# Patient Record
Sex: Male | Born: 1950
Health system: Southern US, Community
[De-identification: ages and names within clinical notes are randomized; demographics above are authoritative.]

## PROBLEM LIST (undated history)

## (undated) DIAGNOSIS — I82409 Acute embolism and thrombosis of unspecified deep veins of unspecified lower extremity: Secondary | ICD-10-CM

## (undated) DIAGNOSIS — J9811 Atelectasis: Secondary | ICD-10-CM

## (undated) DIAGNOSIS — E1110 Type 2 diabetes mellitus with ketoacidosis without coma: Secondary | ICD-10-CM

## (undated) DIAGNOSIS — I509 Heart failure, unspecified: Secondary | ICD-10-CM

## (undated) DIAGNOSIS — N210 Calculus in bladder: Secondary | ICD-10-CM

## (undated) DIAGNOSIS — R0600 Dyspnea, unspecified: Secondary | ICD-10-CM

## (undated) DIAGNOSIS — Z87442 Personal history of urinary calculi: Secondary | ICD-10-CM

## (undated) DIAGNOSIS — E119 Type 2 diabetes mellitus without complications: Secondary | ICD-10-CM

## (undated) DIAGNOSIS — R4182 Altered mental status, unspecified: Secondary | ICD-10-CM

## (undated) DIAGNOSIS — R229 Localized swelling, mass and lump, unspecified: Secondary | ICD-10-CM

## (undated) DIAGNOSIS — I2699 Other pulmonary embolism without acute cor pulmonale: Secondary | ICD-10-CM

## (undated) DIAGNOSIS — I10 Essential (primary) hypertension: Secondary | ICD-10-CM

## (undated) DIAGNOSIS — I428 Other cardiomyopathies: Secondary | ICD-10-CM

## (undated) DIAGNOSIS — J189 Pneumonia, unspecified organism: Secondary | ICD-10-CM

## (undated) HISTORY — PX: COLONOSCOPY: SHX174

---
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
## 2002-08-25 DIAGNOSIS — IMO0002 Reserved for concepts with insufficient information to code with codable children: Secondary | ICD-10-CM

## 2002-08-25 HISTORY — DX: Reserved for concepts with insufficient information to code with codable children: IMO0002

## 2009-08-25 DIAGNOSIS — J189 Pneumonia, unspecified organism: Secondary | ICD-10-CM

## 2009-08-25 HISTORY — DX: Pneumonia, unspecified organism: J18.9

## 2015-07-05 ENCOUNTER — Ambulatory Visit: Payer: Self-pay | Admitting: Family Medicine

## 2015-08-02 ENCOUNTER — Ambulatory Visit: Payer: Self-pay | Admitting: Family Medicine

## 2015-10-10 ENCOUNTER — Ambulatory Visit: Payer: Self-pay | Admitting: Family Medicine

## 2015-11-07 ENCOUNTER — Ambulatory Visit: Payer: Self-pay | Admitting: Family Medicine

## 2016-06-06 DIAGNOSIS — N400 Enlarged prostate without lower urinary tract symptoms: Secondary | ICD-10-CM | POA: Diagnosis not present

## 2016-06-06 DIAGNOSIS — N210 Calculus in bladder: Secondary | ICD-10-CM | POA: Diagnosis not present

## 2016-06-06 DIAGNOSIS — R8290 Unspecified abnormal findings in urine: Secondary | ICD-10-CM | POA: Diagnosis not present

## 2016-06-06 DIAGNOSIS — I10 Essential (primary) hypertension: Secondary | ICD-10-CM | POA: Diagnosis not present

## 2016-06-06 DIAGNOSIS — Z1211 Encounter for screening for malignant neoplasm of colon: Secondary | ICD-10-CM | POA: Diagnosis not present

## 2016-06-06 DIAGNOSIS — Z125 Encounter for screening for malignant neoplasm of prostate: Secondary | ICD-10-CM | POA: Diagnosis not present

## 2016-08-07 DIAGNOSIS — R600 Localized edema: Secondary | ICD-10-CM | POA: Diagnosis not present

## 2016-08-07 DIAGNOSIS — R319 Hematuria, unspecified: Secondary | ICD-10-CM | POA: Diagnosis not present

## 2016-08-07 DIAGNOSIS — Z79899 Other long term (current) drug therapy: Secondary | ICD-10-CM | POA: Diagnosis not present

## 2016-08-07 DIAGNOSIS — I10 Essential (primary) hypertension: Secondary | ICD-10-CM | POA: Diagnosis not present

## 2016-08-07 DIAGNOSIS — D751 Secondary polycythemia: Secondary | ICD-10-CM | POA: Diagnosis not present

## 2016-08-07 DIAGNOSIS — N529 Male erectile dysfunction, unspecified: Secondary | ICD-10-CM | POA: Diagnosis not present

## 2016-08-14 DIAGNOSIS — Z23 Encounter for immunization: Secondary | ICD-10-CM | POA: Diagnosis not present

## 2016-08-14 DIAGNOSIS — Z79899 Other long term (current) drug therapy: Secondary | ICD-10-CM | POA: Diagnosis not present

## 2016-08-14 DIAGNOSIS — D751 Secondary polycythemia: Secondary | ICD-10-CM | POA: Diagnosis not present

## 2016-08-14 DIAGNOSIS — N529 Male erectile dysfunction, unspecified: Secondary | ICD-10-CM | POA: Diagnosis not present

## 2016-08-14 DIAGNOSIS — R319 Hematuria, unspecified: Secondary | ICD-10-CM | POA: Diagnosis not present

## 2016-08-14 DIAGNOSIS — I10 Essential (primary) hypertension: Secondary | ICD-10-CM | POA: Diagnosis not present

## 2016-10-28 DIAGNOSIS — N401 Enlarged prostate with lower urinary tract symptoms: Secondary | ICD-10-CM | POA: Diagnosis not present

## 2016-10-28 DIAGNOSIS — N210 Calculus in bladder: Secondary | ICD-10-CM | POA: Diagnosis not present

## 2016-10-28 DIAGNOSIS — R351 Nocturia: Secondary | ICD-10-CM | POA: Diagnosis not present

## 2017-02-19 DIAGNOSIS — H00016 Hordeolum externum left eye, unspecified eyelid: Secondary | ICD-10-CM | POA: Diagnosis not present

## 2017-02-19 DIAGNOSIS — I10 Essential (primary) hypertension: Secondary | ICD-10-CM | POA: Diagnosis not present

## 2017-02-19 DIAGNOSIS — M6530 Trigger finger, unspecified finger: Secondary | ICD-10-CM | POA: Diagnosis not present

## 2017-02-19 DIAGNOSIS — Z1211 Encounter for screening for malignant neoplasm of colon: Secondary | ICD-10-CM | POA: Diagnosis not present

## 2017-03-25 DIAGNOSIS — R0600 Dyspnea, unspecified: Secondary | ICD-10-CM

## 2017-03-25 HISTORY — DX: Dyspnea, unspecified: R06.00

## 2017-03-28 ENCOUNTER — Emergency Department (HOSPITAL_COMMUNITY): Payer: PPO

## 2017-03-28 ENCOUNTER — Inpatient Hospital Stay (HOSPITAL_COMMUNITY)
Admission: EM | Admit: 2017-03-28 | Discharge: 2017-03-31 | DRG: 871 | Disposition: A | Discharge status: Home Health | Payer: PPO | Attending: Internal Medicine | Admitting: Internal Medicine

## 2017-03-28 ENCOUNTER — Encounter (HOSPITAL_COMMUNITY): Payer: Self-pay | Admitting: Nurse Practitioner

## 2017-03-28 DIAGNOSIS — E870 Hyperosmolality and hypernatremia: Secondary | ICD-10-CM | POA: Diagnosis present

## 2017-03-28 DIAGNOSIS — E1300 Other specified diabetes mellitus with hyperosmolarity without nonketotic hyperglycemic-hyperosmolar coma (NKHHC): Secondary | ICD-10-CM | POA: Diagnosis present

## 2017-03-28 DIAGNOSIS — R4182 Altered mental status, unspecified: Secondary | ICD-10-CM

## 2017-03-28 DIAGNOSIS — R739 Hyperglycemia, unspecified: Secondary | ICD-10-CM

## 2017-03-28 DIAGNOSIS — N390 Urinary tract infection, site not specified: Secondary | ICD-10-CM | POA: Diagnosis not present

## 2017-03-28 DIAGNOSIS — Z87442 Personal history of urinary calculi: Secondary | ICD-10-CM | POA: Diagnosis not present

## 2017-03-28 DIAGNOSIS — A419 Sepsis, unspecified organism: Principal | ICD-10-CM | POA: Diagnosis present

## 2017-03-28 DIAGNOSIS — N210 Calculus in bladder: Secondary | ICD-10-CM | POA: Diagnosis not present

## 2017-03-28 DIAGNOSIS — E860 Dehydration: Secondary | ICD-10-CM | POA: Diagnosis present

## 2017-03-28 DIAGNOSIS — I10 Essential (primary) hypertension: Secondary | ICD-10-CM | POA: Diagnosis present

## 2017-03-28 DIAGNOSIS — N179 Acute kidney failure, unspecified: Secondary | ICD-10-CM | POA: Diagnosis present

## 2017-03-28 DIAGNOSIS — E119 Type 2 diabetes mellitus without complications: Secondary | ICD-10-CM

## 2017-03-28 DIAGNOSIS — G9341 Metabolic encephalopathy: Secondary | ICD-10-CM | POA: Diagnosis not present

## 2017-03-28 DIAGNOSIS — R8290 Unspecified abnormal findings in urine: Secondary | ICD-10-CM | POA: Diagnosis not present

## 2017-03-28 DIAGNOSIS — E1165 Type 2 diabetes mellitus with hyperglycemia: Secondary | ICD-10-CM | POA: Diagnosis not present

## 2017-03-28 DIAGNOSIS — E1110 Type 2 diabetes mellitus with ketoacidosis without coma: Secondary | ICD-10-CM

## 2017-03-28 HISTORY — DX: Altered mental status, unspecified: R41.82

## 2017-03-28 HISTORY — DX: Essential (primary) hypertension: I10

## 2017-03-28 HISTORY — DX: Type 2 diabetes mellitus with ketoacidosis without coma: E11.10

## 2017-03-28 HISTORY — DX: Calculus in bladder: N21.0

## 2017-03-28 LAB — BASIC METABOLIC PANEL
Anion gap: 23 — ABNORMAL HIGH (ref 5–15)
Anion gap: 12 (ref 5–15)
BUN: 55 mg/dL — ABNORMAL HIGH (ref 6–20)
BUN: 44 mg/dL — ABNORMAL HIGH (ref 6–20)
BUN: 39 mg/dL — ABNORMAL HIGH (ref 6–20)
CO2: 16 mmol/L — ABNORMAL LOW (ref 22–32)
CO2: 20 mmol/L — ABNORMAL LOW (ref 22–32)
CO2: 23 mmol/L (ref 22–32)
Calcium: 10.2 mg/dL (ref 8.9–10.3)
Calcium: 9.7 mg/dL (ref 8.9–10.3)
Calcium: 9.7 mg/dL (ref 8.9–10.3)
Chloride: 105 mmol/L (ref 101–111)
Chloride: 130 mmol/L — ABNORMAL HIGH (ref 101–111)
Chloride: >130 mmol/L (ref 101–111)
Creatinine, Ser: 3.11 mg/dL — ABNORMAL HIGH (ref 0.61–1.24)
Creatinine, Ser: 1.94 mg/dL — ABNORMAL HIGH (ref 0.61–1.24)
Creatinine, Ser: 1.55 mg/dL — ABNORMAL HIGH (ref 0.61–1.24)
GFR calc Af Amer: 23 mL/min — ABNORMAL LOW (ref >60)
GFR calc Af Amer: 40 mL/min — ABNORMAL LOW (ref >60)
GFR calc Af Amer: 53 mL/min — ABNORMAL LOW (ref >60)
GFR calc non Af Amer: 20 mL/min — ABNORMAL LOW (ref >60)
GFR calc non Af Amer: 35 mL/min — ABNORMAL LOW (ref >60)
GFR calc non Af Amer: 45 mL/min — ABNORMAL LOW (ref >60)
Glucose, Bld: 1512 mg/dL (ref 65–99)
Glucose, Bld: 465 mg/dL — ABNORMAL HIGH (ref 65–99)
Glucose, Bld: 200 mg/dL — ABNORMAL HIGH (ref 65–99)
Potassium: 5.4 mmol/L — ABNORMAL HIGH (ref 3.5–5.1)
Potassium: 3.5 mmol/L (ref 3.5–5.1)
Potassium: 3.4 mmol/L — ABNORMAL LOW (ref 3.5–5.1)
Sodium: 144 mmol/L (ref 135–145)
Sodium: 162 mmol/L (ref 135–145)
Sodium: 164 mmol/L (ref 135–145)

## 2017-03-28 LAB — I-STAT CHEM 8, ED
BUN: 54 mg/dL — ABNORMAL HIGH (ref 6–20)
Calcium, Ion: 1.3 mmol/L (ref 1.15–1.40)
Chloride: 103 mmol/L (ref 101–111)
Creatinine, Ser: 2.7 mg/dL — ABNORMAL HIGH (ref 0.61–1.24)
Glucose, Bld: >700 mg/dL (ref 65–99)
HCT: 53 % — ABNORMAL HIGH (ref 39.0–52.0)
Hemoglobin: 18 g/dL — ABNORMAL HIGH (ref 13.0–17.0)
Potassium: 4.8 mmol/L (ref 3.5–5.1)
Sodium: 141 mmol/L (ref 135–145)
TCO2: 19 mmol/L (ref 0–100)

## 2017-03-28 LAB — LIPID PANEL
Cholesterol: 204 mg/dL — ABNORMAL HIGH (ref 0–200)
HDL: 44 mg/dL (ref >40)
LDL Cholesterol: 131 mg/dL — ABNORMAL HIGH (ref 0–99)
Total CHOL/HDL Ratio: 4.6 RATIO
Triglycerides: 145 mg/dL (ref <150)
VLDL: 29 mg/dL (ref 0–40)

## 2017-03-28 LAB — I-STAT CG4 LACTIC ACID, ED: Lactic Acid, Venous: 3.96 mmol/L (ref 0.5–1.9)

## 2017-03-28 LAB — CBC WITH DIFFERENTIAL/PLATELET
Basophils Absolute: 0 10*3/uL (ref 0.0–0.1)
Basophils Relative: 0 %
Eosinophils Absolute: 0 10*3/uL (ref 0.0–0.7)
Eosinophils Relative: 0 %
HCT: 51.3 % (ref 39.0–52.0)
Hemoglobin: 17.4 g/dL — ABNORMAL HIGH (ref 13.0–17.0)
Lymphocytes Relative: 4 %
Lymphs Abs: 0.8 10*3/uL (ref 0.7–4.0)
MCH: 27.4 pg (ref 26.0–34.0)
MCHC: 34.3 g/dL (ref 30.0–36.0)
MCV: 80.3 fL (ref 78.0–100.0)
Monocytes Absolute: 0.4 10*3/uL (ref 0.1–1.0)
Monocytes Relative: 2 %
Neutro Abs: 18.1 10*3/uL — ABNORMAL HIGH (ref 1.7–7.7)
Neutrophils Relative %: 94 %
Platelets: 244 10*3/uL (ref 150–400)
RBC: 6.36 MIL/uL — ABNORMAL HIGH (ref 4.22–5.81)
RDW: 13.7 % (ref 11.5–15.5)
WBC: 19.3 10*3/uL — ABNORMAL HIGH (ref 4.0–10.5)

## 2017-03-28 LAB — GLUCOSE, CAPILLARY
Glucose-Capillary: >600 mg/dL (ref 65–99)
Glucose-Capillary: >600 mg/dL (ref 65–99)
Glucose-Capillary: >600 mg/dL (ref 65–99)
Glucose-Capillary: 358 mg/dL — ABNORMAL HIGH (ref 65–99)
Glucose-Capillary: 372 mg/dL — ABNORMAL HIGH (ref 65–99)
Glucose-Capillary: 387 mg/dL — ABNORMAL HIGH (ref 65–99)
Glucose-Capillary: 215 mg/dL — ABNORMAL HIGH (ref 65–99)
Glucose-Capillary: 155 mg/dL — ABNORMAL HIGH (ref 65–99)

## 2017-03-28 LAB — URINALYSIS, ROUTINE W REFLEX MICROSCOPIC
Bacteria, UA: NONE SEEN
Bilirubin Urine: NEGATIVE
Glucose, UA: >=500 mg/dL — AB
Ketones, ur: 5 mg/dL — AB
Leukocytes, UA: NEGATIVE
Nitrite: NEGATIVE
Protein, ur: NEGATIVE mg/dL
RBC / HPF: NONE SEEN RBC/hpf (ref 0–5)
Specific Gravity, Urine: 1.024 (ref 1.005–1.030)
pH: 5 (ref 5.0–8.0)

## 2017-03-28 LAB — COMPREHENSIVE METABOLIC PANEL
ALT: 65 U/L — ABNORMAL HIGH (ref 17–63)
AST: 33 U/L (ref 15–41)
Albumin: 4.4 g/dL (ref 3.5–5.0)
Alkaline Phosphatase: 99 U/L (ref 38–126)
Anion gap: 20 — ABNORMAL HIGH (ref 5–15)
BUN: 58 mg/dL — ABNORMAL HIGH (ref 6–20)
CO2: 21 mmol/L — ABNORMAL LOW (ref 22–32)
Calcium: 10.4 mg/dL — ABNORMAL HIGH (ref 8.9–10.3)
Chloride: 98 mmol/L — ABNORMAL LOW (ref 101–111)
Creatinine, Ser: 3.48 mg/dL — ABNORMAL HIGH (ref 0.61–1.24)
GFR calc Af Amer: 20 mL/min — ABNORMAL LOW (ref >60)
GFR calc non Af Amer: 17 mL/min — ABNORMAL LOW (ref >60)
Glucose, Bld: 1492 mg/dL (ref 65–99)
Potassium: 4.7 mmol/L (ref 3.5–5.1)
Sodium: 139 mmol/L (ref 135–145)
Total Bilirubin: 1.8 mg/dL — ABNORMAL HIGH (ref 0.3–1.2)
Total Protein: 6.9 g/dL (ref 6.5–8.1)

## 2017-03-28 LAB — LACTIC ACID, PLASMA
Lactic Acid, Venous: 4.2 mmol/L (ref 0.5–1.9)
Lactic Acid, Venous: 3.4 mmol/L (ref 0.5–1.9)

## 2017-03-28 LAB — BLOOD GAS, ARTERIAL
Acid-base deficit: 5.6 mmol/L — ABNORMAL HIGH (ref 0.0–2.0)
Bicarbonate: 18.4 mmol/L — ABNORMAL LOW (ref 20.0–28.0)
Drawn by: 129711
FIO2: 21
O2 Saturation: 97.5 %
Patient temperature: 98.6
pCO2 arterial: 30.6 mmHg — ABNORMAL LOW (ref 32.0–48.0)
pH, Arterial: 7.396 (ref 7.350–7.450)
pO2, Arterial: 91.3 mmHg (ref 83.0–108.0)

## 2017-03-28 LAB — I-STAT VENOUS BLOOD GAS, ED
Acid-base deficit: 8 mmol/L — ABNORMAL HIGH (ref 0.0–2.0)
Bicarbonate: 17.8 mmol/L — ABNORMAL LOW (ref 20.0–28.0)
O2 Saturation: 67 %
TCO2: 19 mmol/L (ref 0–100)
pCO2, Ven: 38.2 mmHg — ABNORMAL LOW (ref 44.0–60.0)
pH, Ven: 7.275 (ref 7.250–7.430)
pO2, Ven: 39 mmHg (ref 32.0–45.0)

## 2017-03-28 LAB — LIPASE, BLOOD: Lipase: 249 U/L — ABNORMAL HIGH (ref 11–51)

## 2017-03-28 LAB — MAGNESIUM: Magnesium: 3.7 mg/dL — ABNORMAL HIGH (ref 1.7–2.4)

## 2017-03-28 LAB — CBG MONITORING, ED
Glucose-Capillary: >600 mg/dL (ref 65–99)
Glucose-Capillary: >600 mg/dL (ref 65–99)
Glucose-Capillary: >600 mg/dL (ref 65–99)
Glucose-Capillary: >600 mg/dL (ref 65–99)

## 2017-03-28 LAB — I-STAT TROPONIN, ED: Troponin i, poc: 0 ng/mL (ref 0.00–0.08)

## 2017-03-28 LAB — PHOSPHORUS: Phosphorus: 4.7 mg/dL — ABNORMAL HIGH (ref 2.5–4.6)

## 2017-03-28 LAB — MRSA PCR SCREENING: MRSA by PCR: NEGATIVE

## 2017-03-28 MED ORDER — DEXTROSE-NACL 5-0.45 % IV SOLN: INTRAVENOUS | Status: DC

## 2017-03-28 MED ORDER — VANCOMYCIN HCL IN DEXTROSE 1-5 GM/200ML-% IV SOLN
1000.0000 mg | Freq: Once | INTRAVENOUS | Status: AC
  Administered 2017-03-28: 1000 mg via INTRAVENOUS
  Filled 2017-03-28: qty 200

## 2017-03-28 MED ORDER — CEFTRIAXONE SODIUM 1 G IJ SOLR
1.0000 g | INTRAMUSCULAR | Status: DC
  Administered 2017-03-28 – 2017-03-31 (×4): 1 g via INTRAVENOUS
  Filled 2017-03-28 (×5): qty 10

## 2017-03-28 MED ORDER — ENOXAPARIN SODIUM 30 MG/0.3ML ~~LOC~~ SOLN
30.0000 mg | SUBCUTANEOUS | Status: DC
  Administered 2017-03-28: 30 mg via SUBCUTANEOUS
  Filled 2017-03-28: qty 0.3

## 2017-03-28 MED ORDER — SODIUM CHLORIDE 0.9 % IV SOLN
INTRAVENOUS | Status: DC
  Filled 2017-03-28: qty 1

## 2017-03-28 MED ORDER — SODIUM CHLORIDE 0.9 % IV SOLN
INTRAVENOUS | Status: DC
  Administered 2017-03-28 (×2): via INTRAVENOUS

## 2017-03-28 MED ORDER — SODIUM CHLORIDE 0.9 % IV BOLUS (SEPSIS)
1000.0000 mL | INTRAVENOUS | Status: DC | PRN
  Administered 2017-03-28: 1000 mL via INTRAVENOUS

## 2017-03-28 MED ORDER — LORAZEPAM 2 MG/ML IJ SOLN
1.0000 mg | INTRAMUSCULAR | Status: DC | PRN
Start: 2017-03-28 — End: 2017-03-31
  Filled 2017-03-28: qty 1

## 2017-03-28 MED ORDER — SODIUM CHLORIDE 0.9 % IV BOLUS (SEPSIS)
1000.0000 mL | Freq: Once | INTRAVENOUS | Status: AC
  Administered 2017-03-28: 1000 mL via INTRAVENOUS

## 2017-03-28 MED ORDER — POTASSIUM CHLORIDE 10 MEQ/100ML IV SOLN
10.0000 meq | INTRAVENOUS | Status: AC
  Administered 2017-03-28 (×2): 10 meq via INTRAVENOUS
  Filled 2017-03-28 (×2): qty 100

## 2017-03-28 MED ORDER — SODIUM CHLORIDE 0.9 % IV SOLN
INTRAVENOUS | Status: DC
  Administered 2017-03-28: 7.6 [IU]/h via INTRAVENOUS
  Administered 2017-03-28: 5.4 [IU]/h via INTRAVENOUS
  Administered 2017-03-28: 21.8 [IU]/h via INTRAVENOUS
  Filled 2017-03-28 (×3): qty 1

## 2017-03-28 MED ORDER — VANCOMYCIN HCL IN DEXTROSE 750-5 MG/150ML-% IV SOLN: 750.0000 mg | INTRAVENOUS | Status: DC

## 2017-03-28 MED ORDER — DEXTROSE-NACL 5-0.45 % IV SOLN
INTRAVENOUS | Status: DC
  Administered 2017-03-28: 23:00:00 via INTRAVENOUS

## 2017-03-28 NOTE — Progress Notes (Addendum)
Follow-up BMET due at 239 but there was a delay in obtaining specimen. One specimen actually obtained was inadequate and phlebotomist has to return to attempt to obtain a new specimen. In the interim patient has developed hypotension with systolic blood pressures in the 80s so 2 L saline bolus initiated-patient has 2 lines so concurrently infusing 1 L per each arm. Patient sleepy but easily awakens especially to stimulus. Have opted to obtain a follow-up lactic acid in addition to following AG on electrolyte panel. It is noted that electrolyte panel from 10:39 AM showed AG had increased to 23 and serum glucose had increased to > 1500 despite high-dose insulin infusion. Stat ABG has been ordered to better clarify pH status patient may benefit from bicarbonate infusion in addition to aggressive volume resuscitation. Discussed again with wife any potential infectious symptoms such as fevers, chills nausea vomiting or diarrhea or urinary symptoms other than increased urinary output. No infectious symptoms reported. For completeness of care we will treat as possible sepsis and begin broad-spectrum antibiotics with Zosyn and vancomycin. We'll also attempt to obtain blood cultures. Patient currently has about 650 mL of clear yellow urine and bedside urine bag (condom catheter)  Junious SilkAllison Anish Vana, ANP

## 2017-03-28 NOTE — ED Notes (Signed)
Dr Melynda RippleHobbs in room

## 2017-03-28 NOTE — H&P (Signed)
History and Physical    Todd HaberStephan Fehrman ZOX:[id number] DOB: 01/31/1951 DOA: 03/28/2017   PCP: Todd Avila, Sharon, Todd Avila   Attending physician: Melynda RippleHobbs  Patient coming from/Resides with: Private residence/wife  Chief Complaint: Altered mental status  HPI: Todd Avila is a 66 y.o. male with medical history significant for hypertension and bladder stones. Patient had been in his usual state of health until several weeks ago when he developed increased urinary output. Patient attributed this to suspected recurrence of bladder stones since he has a history of similar symptoms with prior bladder stones. Of note he had a surgical procedure to extract bladder stones in January of this year. Over the past 48 hours the wife has noticed marked increase in urinary output from the patient, increased thirst and hunger in addition to progressive decline in mental function. By this morning patient was agitated and quite confused therefore EMS was called to the home and the patient was sent to the ER. In the field his initial CBG was 250. Was apparently normotensive on the scene. Upon arrival to the ER patient was afebrile, he was not tachycardic, his BP was 96/60. An electrolyte panel was obtained that revealed a glucose of 1492 with an initial BUN of 58/creatinine 3.48 with improvement in creatinine after several fluid challenges. Patient's anion gap was 20 and a lactic acid was elevated at 3.96. He was also hemoconcentrated with hemoglobin of 17.4. As noted above patient has been given fluid challenges and is being treated as new onset diabetes with DKA.  ED Course:  Vital Signs: BP 110/66   Pulse 88   Temp 97.6 F (36.4 C) (Oral)   Resp (!) 27   SpO2 94%  PCXR: Neg CT Head: Neg Lab data: Sodium 139, potassium 4.7, chloride 98, CO2 21, glucose 1492, BUN 58, creatinine 3.48, calcium 10.4, anion gap 20, LFTs normal except for ALT 65 and total bilirubin 1.8, white count 19,300 hemoglobin 17.4, platelets  neutrophils and absolute neutrophils results pending at time of dictation, lactic acid 3.96, poc troponin 0.00, urinalysis abnormal with greater than 500 glucose, large hemoglobin, 5 ketones, specific gravity 1.024, wbc's 6-30-urine culture obtained in the ER Medications and treatments: Normal saline bolus 2 L  Review of Systems:  In addition to the HPI above,  No Fever-chills, myalgias or other constitutional symptoms No Headache, changes with Vision or hearing, new weakness, tingling, numbness in any extremity, dizziness, dysarthria or word finding difficulty, tremors or seizure activity No problems swallowing food or Liquids, indigestion/reflux, choking or coughing while eating, abdominal pain with or after eating No Chest pain, Cough or Shortness of Breath, palpitations, orthopnea or DOE No Abdominal pain, N/V, melena,hematochezia, dark tarry stools, constipation No dysuria, malodorous urine, hematuria or flank pain No new skin rashes, lesions, masses or bruises, No new joint pains, aches, swelling or redness No recent unintentional weight gain     Past Medical History:  Diagnosis Date  . Bladder stones   . HTN (hypertension)     Past Surgical History:  Procedure Laterality Date  . BLADDER STONE REMOVAL      Social History   Social History  . Marital status: Married    Spouse name: N/A  . Number of children: N/A  . Years of education: N/A   Occupational History  . retired    Social History Main Topics  . Smoking status: Never Smoker  . Smokeless tobacco: Never Used  . Alcohol use No  . Drug use: No  . Sexual activity: Yes  Other Topics Concern  . Not on file   Social History Narrative  . No narrative on file    Mobility: Independent Work history: Retired   No Known Allergies   Family history reviewed and significant for unknown family member with diabetes  Prior to Admission medications   Losartan -dosage unknown     Physical Exam: Vitals:    03/28/17 0945 03/28/17 1022  BP: 96/60 110/66  Pulse: 88   Resp: 20 (!) 27  Temp: 97.6 F (36.4 C)   TempSrc: Oral   SpO2: 94%       Constitutional: NAD, Restless, mildly anxious and occasionally agitated-seems uncomfortable Eyes: PERRL, lids and conjunctivae normal-sclera mildly injected bilaterally ENMT: Mucous membranes are dry. Posterior pharynx clear of any exudate or lesions. Normal dentition. Acetone odor to breath Neck: normal, supple, no masses, no thyromegaly Respiratory: clear to auscultation bilaterally, no wheezing, no crackles. Normal respiratory effort. No accessory muscle use.  Cardiovascular: Regular rate and rhythm, no murmurs / rubs / gallops. No extremity edema. 2+ pedal pulses. No carotid bruits. Feet and hands cool to touch Abdomen: no tenderness, no masses palpated. No hepatosplenomegaly. Bowel sounds positive.  Musculoskeletal: no clubbing / cyanosis. No joint deformity upper and lower extremities. Good ROM, no contractures. Normal muscle tone.  Skin: no rashes, lesions, ulcers. No induration Neurologic: CN 2-12 grossly intact. Sensation intact, DTR normal. Strength 5/5 x all 4 extremities.  Psychiatric: Awake and oriented x 3 but is slow to respond to questions. Anxious mildly agitated mood.    Labs on Admission: I have personally reviewed following labs and imaging studies  CBC:  Recent Labs Lab 03/28/17 0927 03/28/17 0955  WBC 19.3*  --   NEUTROABS PENDING  --   HGB 17.4* 18.0*  HCT PENDING 53.0*  MCV PENDING  --   PLT 244  --    Basic Metabolic Panel:  Recent Labs Lab 03/28/17 0927 03/28/17 0955  NA 139 141  K 4.7 4.8  CL 98* 103  CO2 21*  --   GLUCOSE 1,492* >700*  BUN 58* 54*  CREATININE 3.48* 2.70*  CALCIUM 10.4*  --    GFR: CrCl cannot be calculated (Unknown ideal weight.). Liver Function Tests:  Recent Labs Lab 03/28/17 0927  AST 33  ALT 65*  ALKPHOS 99  BILITOT 1.8*  PROT 6.9  ALBUMIN 4.4   No results for input(s):  LIPASE, AMYLASE in the last 168 hours. No results for input(s): AMMONIA in the last 168 hours. Coagulation Profile: No results for input(s): INR, PROTIME in the last 168 hours. Cardiac Enzymes: No results for input(s): CKTOTAL, CKMB, CKMBINDEX, TROPONINI in the last 168 hours. BNP (last 3 results) No results for input(s): PROBNP in the last 8760 hours. HbA1C: No results for input(s): HGBA1C in the last 72 hours. CBG:  Recent Labs Lab 03/28/17 0942  GLUCAP >600*   Lipid Profile: No results for input(s): CHOL, HDL, LDLCALC, TRIG, CHOLHDL, LDLDIRECT in the last 72 hours. Thyroid Function Tests: No results for input(s): TSH, T4TOTAL, FREET4, T3FREE, THYROIDAB in the last 72 hours. Anemia Panel: No results for input(s): VITAMINB12, FOLATE, FERRITIN, TIBC, IRON, RETICCTPCT in the last 72 hours. Urine analysis:    Component Value Date/Time   COLORURINE YELLOW 03/28/2017 0934   APPEARANCEUR CLEAR 03/28/2017 0934   LABSPEC 1.024 03/28/2017 0934   PHURINE 5.0 03/28/2017 0934   GLUCOSEU >=500 (A) 03/28/2017 0934   HGBUR LARGE (A) 03/28/2017 0934   BILIRUBINUR NEGATIVE 03/28/2017 0934   KETONESUR 5 (  A) 03/28/2017 0934   PROTEINUR NEGATIVE 03/28/2017 0934   NITRITE NEGATIVE 03/28/2017 0934   LEUKOCYTESUR NEGATIVE 03/28/2017 0934   Sepsis Labs: @LABRCNTIP (procalcitonin:4,lacticidven:4) )No results found for this or any previous visit (from the past 240 hour(s)).   Radiological Exams on Admission: Ct Head Wo Contrast  Result Date: 03/28/2017 CLINICAL DATA:  66 year old male with altered mental status for 3 days. EXAM: CT HEAD WITHOUT CONTRAST TECHNIQUE: Contiguous axial images were obtained from the base of the skull through the vertex without intravenous contrast. COMPARISON:  None. FINDINGS: Brain: No evidence of acute infarction, hemorrhage, hydrocephalus, extra-axial collection or mass lesion/mass effect. Mild cerebral atrophy noted. Vascular: No hyperdense vessel or unexpected  calcification. Skull: Normal. Negative for fracture or focal lesion. Sinuses/Orbits: No acute finding. Other: None. IMPRESSION: No evidence of acute intracranial abnormality. Mild atrophy. Electronically Signed   By: Harmon Pier M.D.   On: 03/28/2017 10:13   Dg Chest Port 1 View  Result Date: 03/28/2017 CLINICAL DATA:  Altered mental status. EXAM: PORTABLE CHEST 1 VIEW COMPARISON:  None. FINDINGS: The cardiomediastinal silhouette is unremarkable. This is a low volume film There is no evidence of focal airspace disease, pulmonary edema, suspicious pulmonary nodule/mass, pleural effusion, or pneumothorax. No acute bony abnormalities are identified. IMPRESSION: No evidence of acute cardiopulmonary disease. Electronically Signed   By: Harmon Pier M.D.   On: 03/28/2017 10:03    EKG: (Independently reviewed) sinus rhythm with ventricular rate 93 bpm, QTC 494 ms, normal R-wave rotation, no acute ischemic changes  Assessment/Plan Principal Problem:   DKA, type 2/New onset type 2 diabetes mellitus  -Patient presents with classic hyperglycemic symptomatology: Polyuria, polydipsia and polyphagia with altered mental status and acetone breath with documented severe hyperglycemia glucose greater than 1000 with an anion gap of 20 -Insulin gtt -Has received 2 L IV fluid in the ER-may need to consider additional fluid challenges to keep blood pressure greater than 90 -NS IV @ 150/hr and transition to dextrose containing fluids 1 CBG less than 250 -BMET q 4 hrs until AG closed -Noncaloric liquids until AG closed -Diabetes educator consultation; will need glucometer teaching and prescription for glucometer at discharge -HgbA1c-pending results patient will discharge on oral agent versus long-acting insulin -Was on ARB prior to admission for hypertension -Screening lipid panel  Active Problems:   Acute kidney injury  -Baseline renal function unknown -Current renal function markedly abnormal with a creatinine of  3.48 and GFR of 20 -Suspect prerenal etiology secondary to dehydration as well as a degree of ATN from concomitant use of ARB -Hold ARB until renal function normalizes -Treat underlying causes -Follow labs noting creatinine has decreased to 2.7 after fluid challenges in the ER    Abnormal urinalysis/ Bladder stones -Patient has abnormal urinalysis with a history of bladder stones and leukocytosis at presentation -Urine culture obtained -Leukocytosis likely reflective of DKA but given above history will begin empiric Rocephin until urine culture returned    HTN (hypertension) -Current blood pressure suboptimal in setting of volume depletion from DKA -Hold home ARB       DVT prophylaxis: Lovenox dose adjusted for low GFR Code Status: Full  Family Communication: Wife  Disposition Plan: Home Consults called: None    Oden Lindaman L. ANP-BC [facility] Pager [phone]   If 7PM-7AM, please contact night-coverage [email or web address] Password [email or web address]  03/28/2017, 10:59 AM

## 2017-03-28 NOTE — ED Notes (Signed)
Got patient undress on the monitor did ekg shown to Dr Silverio LayYao checked patient blood sugar, patient is resting with family at bedside

## 2017-03-28 NOTE — ED Notes (Signed)
Checked patient blood sugar it was reading greater than 600 notified RN Autrey of blood sugar

## 2017-03-28 NOTE — ED Provider Notes (Signed)
[facility]-EMERGENCY DEPT Provider Note   CSN: [id number] Arrival date & time: 03/28/17  [id number]     History   Chief Complaint Chief Complaint  Patient presents with  . Hyperglycemia    HPI Todd Avila is a 66 y.o. male history of hypertension, bladder stones here presenting with altered mental status, confusion. Patient recently came [place] from [place]. For the last 2 days, patient has poor appetite and she noticed that he is just very confused. He has been refusing to eat but denies any vomiting or fevers. He has a history of bladder stones and wife noticed that he's been urinating very frequently. She noticed that he seemed very dehydrated. This morning he seemed very confused so EMS was called. CBG was greater than 600 per EMS. No hx of diabetes. Patient follows up with Dr. Paulino RilyWolters from [facility] and had annual check up in June and apparently labs were normal per wife.   The history is provided by the EMS personnel and the spouse.    No past medical history on file.  There are no active problems to display for this patient.   No past surgical history on file.     Home Medications    Prior to Admission medications   Not on File    Family History No family history on file.  Social History Social History  Substance Use Topics  . Smoking status: Not on file  . Smokeless tobacco: Not on file  . Alcohol use Not on file     Allergies   Patient has no known allergies.   Review of Systems Review of Systems  Unable to perform ROS: Mental status change  All other systems reviewed and are negative.    Physical Exam Updated Vital Signs BP 96/60 (BP Location: Right Arm)   Pulse 88   Temp 97.6 F (36.4 C) (Oral)   Resp 20   SpO2 94%   Physical Exam  Constitutional:  Confused, dehydrated   HENT:  Head: Normocephalic.  MM dry   Eyes: Pupils are equal, round, and reactive to light.  Neck: Normal range of motion.  Cardiovascular:  Regular rhythm.   Slightly tachy   Pulmonary/Chest: Effort normal and breath sounds normal.  Abdominal: Soft. Bowel sounds are normal.  Musculoskeletal: Normal range of motion.  Neurological:  Confused, moving all extremities. No obvious facial droop. Difficult to arouse   Skin: Skin is warm.  Psychiatric:  Unable   Nursing note and vitals reviewed.    ED Treatments / Results  Labs (all labs ordered are listed, but only abnormal results are displayed) Labs Reviewed  CBC WITH DIFFERENTIAL/PLATELET - Abnormal; Notable for the following:       Result Value   WBC 19.3 (*)    RBC 6.36 (*)    Hemoglobin 17.4 (*)    All other components within normal limits  URINALYSIS, ROUTINE W REFLEX MICROSCOPIC - Abnormal; Notable for the following:    Glucose, UA >=500 (*)    Hgb urine dipstick LARGE (*)    Ketones, ur 5 (*)    Squamous Epithelial / LPF 0-5 (*)    All other components within normal limits  CBG MONITORING, ED - Abnormal; Notable for the following:    Glucose-Capillary >600 (*)    All other components within normal limits  I-STAT CG4 LACTIC ACID, ED - Abnormal; Notable for the following:    Lactic Acid, Venous 3.96 (*)    All other components within normal  limits  I-STAT VENOUS BLOOD GAS, ED - Abnormal; Notable for the following:    pCO2, Ven 38.2 (*)    Bicarbonate 17.8 (*)    Acid-base deficit 8.0 (*)    All other components within normal limits  I-STAT CHEM 8, ED - Abnormal; Notable for the following:    BUN 54 (*)    Creatinine, Ser 2.70 (*)    Glucose, Bld >700 (*)    Hemoglobin 18.0 (*)    HCT 53.0 (*)    All other components within normal limits  URINE CULTURE  COMPREHENSIVE METABOLIC PANEL  BLOOD GAS, VENOUS  I-STAT TROPONIN, ED    EKG  EKG Interpretation  Date/Time:  Saturday March 28 2017 09:30:11 EDT Ventricular Rate:  93 PR Interval:    QRS Duration: 105 QT Interval:  397 QTC Calculation: 494 R Axis:   101 Text Interpretation:  Sinus rhythm  Right axis deviation Minimal ST depression, inferior leads Borderline prolonged QT interval No previous ECGs available Confirmed by Richardean CanalYao, Rishav Rockefeller H [phone]([id number]) on 03/28/2017 9:49:39 AM       Radiology Ct Head Wo Contrast  Result Date: 03/28/2017 CLINICAL DATA:  66 year old male with altered mental status for 3 days. EXAM: CT HEAD WITHOUT CONTRAST TECHNIQUE: Contiguous axial images were obtained from the base of the skull through the vertex without intravenous contrast. COMPARISON:  None. FINDINGS: Brain: No evidence of acute infarction, hemorrhage, hydrocephalus, extra-axial collection or mass lesion/mass effect. Mild cerebral atrophy noted. Vascular: No hyperdense vessel or unexpected calcification. Skull: Normal. Negative for fracture or focal lesion. Sinuses/Orbits: No acute finding. Other: None. IMPRESSION: No evidence of acute intracranial abnormality. Mild atrophy. Electronically Signed   By: Harmon PierJeffrey  Hu M.D.   On: 03/28/2017 10:13   Dg Chest Port 1 View  Result Date: 03/28/2017 CLINICAL DATA:  Altered mental status. EXAM: PORTABLE CHEST 1 VIEW COMPARISON:  None. FINDINGS: The cardiomediastinal silhouette is unremarkable. This is a low volume film There is no evidence of focal airspace disease, pulmonary edema, suspicious pulmonary nodule/mass, pleural effusion, or pneumothorax. No acute bony abnormalities are identified. IMPRESSION: No evidence of acute cardiopulmonary disease. Electronically Signed   By: Harmon PierJeffrey  Hu M.D.   On: 03/28/2017 10:03    Procedures Procedures (including critical care time)   CRITICAL CARE Performed by: Richardean Canalavid H Annsleigh Dragoo   Total critical care time: 30 minutes  Critical care time was exclusive of separately billable procedures and treating other patients.  Critical care was necessary to treat or prevent imminent or life-threatening deterioration.  Critical care was time spent personally by me on the following activities: development of treatment plan with patient and/or  surrogate as well as nursing, discussions with consultants, evaluation of patient's response to treatment, examination of patient, obtaining history from patient or surrogate, ordering and performing treatments and interventions, ordering and review of laboratory studies, ordering and review of radiographic studies, pulse oximetry and re-evaluation of patient's condition.   Medications Ordered in ED Medications  sodium chloride 0.9 % bolus 1,000 mL (not administered)  sodium chloride 0.9 % bolus 1,000 mL (not administered)  dextrose 5 %-0.45 % sodium chloride infusion (not administered)  insulin regular (NOVOLIN R,HUMULIN R) 100 Units in sodium chloride 0.9 % 100 mL (1 Units/mL) infusion (not administered)     Initial Impression / Assessment and Plan / ED Course  I have reviewed the triage vital signs and the nursing notes.  Pertinent labs & imaging results that were available during my care of the patient were reviewed by  me and considered in my medical decision making (see chart for details).    Carson Bogden is a 66 y.o. male here with AMS, confusion, hyperglycemia. Concerned for possible new onset diabetes with hyperosmolar with coma. CBG was > 600. Will get labs, CT head, CXR, UA. Will give IVF, start IV insulin.   10:23 AM VBG showed pH 7.27. Bicarb 17. Cr 2.7. AG 19. CT head unremarkable. CXR and UA showed no obvious infection. WBC 19 likely from stress from DKA vs hyperosmolar. Given 2 L NS bolus. Started on [employer]. Will admit for DKA vs hyperosmolar.    Final Clinical Impressions(s) / ED Diagnoses   Final diagnoses:  None    New Prescriptions New Prescriptions   No medications on file     Charlynne Pander, MD 03/28/17 1024

## 2017-03-28 NOTE — Progress Notes (Signed)
MD made aware of bp 78/67 orders received for fluid bolus

## 2017-03-28 NOTE — ED Triage Notes (Addendum)
Family reports Pt had a change in Mental status over the last 3 days. EMS reported CBG initial CBG 250 . Pt is not known to be diabetic . Pt is slow to respond to questions  On arrival to room. EMS reportsp 11/64, Pt  Lives at home and independent. Pt is waiting  Surgery to remove Kidney stones in SEPT.

## 2017-03-28 NOTE — ED Notes (Signed)
Critical glucose of 1512 called to this RN by lab.

## 2017-03-28 NOTE — Progress Notes (Signed)
Pharmacy Antibiotic Note  Todd Avila is a 66 y.o. male admitted on 03/28/2017 with altered mental status/ sepsis.  Pharmacy has been consulted for Vancomycin  Dosing.WB C elevated at 19.3 and Tmax 97.6. Patient was originally started on ceftriaxone alone but is now being broadened due to clinical worsening and hypotension. AKI with SCr 3.48 and CrCl~ 22 ml/min.   Plan: Vancomycin 1000 mg X1 then 750 mg every 24 hour Monitor cultures, renal function, and length of therapy  Height: 5\' 7"  (170.2 cm) Weight: 161 lb (73 kg) IBW/kg (Calculated) : 66.1  Temp (24hrs), Avg:97.5 F (36.4 C), Min:97.4 F (36.3 C), Max:97.6 F (36.4 C)   Recent Labs Lab 03/28/17 0927 03/28/17 0947 03/28/17 0955 03/28/17 1031  WBC 19.3*  --   --   --   CREATININE 3.48*  --  2.70* 3.11*  LATICACIDVEN  --  3.96*  --   --     Estimated Creatinine Clearance: 22.1 mL/min (A) (by C-G formula based on SCr of 3.11 mg/dL (H)).    No Known Allergies  Antimicrobials this admission: CTX 8/4>> Vanc 8/4>>   Microbiology results: 8/4  BCx:  8/4 UCx:   8/4 MRSA PCR: Negative   Thank you for allowing pharmacy to be a part of this patient's care.  Della GooEmily S Anijah Spohr, PharmD PGY2 Infectious Diseases Pharmacy Resident Pager: [phone]  03/28/2017 5:25 PM

## 2017-03-29 ENCOUNTER — Inpatient Hospital Stay (HOSPITAL_COMMUNITY): Payer: PPO

## 2017-03-29 DIAGNOSIS — N179 Acute kidney failure, unspecified: Secondary | ICD-10-CM

## 2017-03-29 DIAGNOSIS — N210 Calculus in bladder: Secondary | ICD-10-CM

## 2017-03-29 DIAGNOSIS — E1300 Other specified diabetes mellitus with hyperosmolarity without nonketotic hyperglycemic-hyperosmolar coma (NKHHC): Secondary | ICD-10-CM

## 2017-03-29 DIAGNOSIS — R8290 Unspecified abnormal findings in urine: Secondary | ICD-10-CM

## 2017-03-29 HISTORY — DX: Calculus in bladder: N21.0

## 2017-03-29 LAB — BASIC METABOLIC PANEL
Anion gap: 14 (ref 5–15)
Anion gap: 16 — ABNORMAL HIGH (ref 5–15)
Anion gap: 14 (ref 5–15)
Anion gap: 11 (ref 5–15)
BUN: 37 mg/dL — ABNORMAL HIGH (ref 6–20)
BUN: 36 mg/dL — ABNORMAL HIGH (ref 6–20)
BUN: 35 mg/dL — ABNORMAL HIGH (ref 6–20)
BUN: 35 mg/dL — ABNORMAL HIGH (ref 6–20)
BUN: 29 mg/dL — ABNORMAL HIGH (ref 6–20)
CO2: 23 mmol/L (ref 22–32)
CO2: 21 mmol/L — ABNORMAL LOW (ref 22–32)
CO2: 20 mmol/L — ABNORMAL LOW (ref 22–32)
CO2: 22 mmol/L (ref 22–32)
CO2: 21 mmol/L — ABNORMAL LOW (ref 22–32)
Calcium: 9.3 mg/dL (ref 8.9–10.3)
Calcium: 9.1 mg/dL (ref 8.9–10.3)
Calcium: 9.3 mg/dL (ref 8.9–10.3)
Calcium: 9.3 mg/dL (ref 8.9–10.3)
Calcium: 9.6 mg/dL (ref 8.9–10.3)
Chloride: >130 mmol/L (ref 101–111)
Chloride: 128 mmol/L — ABNORMAL HIGH (ref 101–111)
Chloride: 127 mmol/L — ABNORMAL HIGH (ref 101–111)
Chloride: 126 mmol/L — ABNORMAL HIGH (ref 101–111)
Chloride: 129 mmol/L — ABNORMAL HIGH (ref 101–111)
Creatinine, Ser: 1.42 mg/dL — ABNORMAL HIGH (ref 0.61–1.24)
Creatinine, Ser: 1.39 mg/dL — ABNORMAL HIGH (ref 0.61–1.24)
Creatinine, Ser: 1.38 mg/dL — ABNORMAL HIGH (ref 0.61–1.24)
Creatinine, Ser: 1.28 mg/dL — ABNORMAL HIGH (ref 0.61–1.24)
Creatinine, Ser: 1.18 mg/dL (ref 0.61–1.24)
GFR calc Af Amer: 58 mL/min — ABNORMAL LOW (ref >60)
GFR calc Af Amer: 60 mL/min — ABNORMAL LOW (ref >60)
GFR calc Af Amer: >60 mL/min (ref >60)
GFR calc Af Amer: >60 mL/min (ref >60)
GFR calc Af Amer: >60 mL/min (ref >60)
GFR calc non Af Amer: 50 mL/min — ABNORMAL LOW (ref >60)
GFR calc non Af Amer: 52 mL/min — ABNORMAL LOW (ref >60)
GFR calc non Af Amer: 52 mL/min — ABNORMAL LOW (ref >60)
GFR calc non Af Amer: 57 mL/min — ABNORMAL LOW (ref >60)
GFR calc non Af Amer: >60 mL/min (ref >60)
Glucose, Bld: 157 mg/dL — ABNORMAL HIGH (ref 65–99)
Glucose, Bld: 229 mg/dL — ABNORMAL HIGH (ref 65–99)
Glucose, Bld: 331 mg/dL — ABNORMAL HIGH (ref 65–99)
Glucose, Bld: 351 mg/dL — ABNORMAL HIGH (ref 65–99)
Glucose, Bld: 228 mg/dL — ABNORMAL HIGH (ref 65–99)
Potassium: 3.7 mmol/L (ref 3.5–5.1)
Potassium: 4 mmol/L (ref 3.5–5.1)
Potassium: 4.1 mmol/L (ref 3.5–5.1)
Potassium: 4.3 mmol/L (ref 3.5–5.1)
Potassium: 4.5 mmol/L (ref 3.5–5.1)
Sodium: 164 mmol/L (ref 135–145)
Sodium: 163 mmol/L (ref 135–145)
Sodium: 163 mmol/L (ref 135–145)
Sodium: 162 mmol/L (ref 135–145)
Sodium: 161 mmol/L (ref 135–145)

## 2017-03-29 LAB — GLUCOSE, CAPILLARY
Glucose-Capillary: 117 mg/dL — ABNORMAL HIGH (ref 65–99)
Glucose-Capillary: 150 mg/dL — ABNORMAL HIGH (ref 65–99)
Glucose-Capillary: 216 mg/dL — ABNORMAL HIGH (ref 65–99)
Glucose-Capillary: 290 mg/dL — ABNORMAL HIGH (ref 65–99)
Glucose-Capillary: 292 mg/dL — ABNORMAL HIGH (ref 65–99)
Glucose-Capillary: 372 mg/dL — ABNORMAL HIGH (ref 65–99)

## 2017-03-29 LAB — HIV ANTIBODY (ROUTINE TESTING W REFLEX): HIV Screen 4th Generation wRfx: NONREACTIVE

## 2017-03-29 MED ORDER — INSULIN ASPART 100 UNIT/ML ~~LOC~~ SOLN
5.0000 [IU] | Freq: Three times a day (TID) | SUBCUTANEOUS | Status: DC
  Administered 2017-03-29 – 2017-03-31 (×7): 5 [IU] via SUBCUTANEOUS

## 2017-03-29 MED ORDER — INSULIN GLARGINE 100 UNIT/ML ~~LOC~~ SOLN
15.0000 [IU] | Freq: Two times a day (BID) | SUBCUTANEOUS | Status: DC
  Administered 2017-03-29 – 2017-03-31 (×4): 15 [IU] via SUBCUTANEOUS
  Filled 2017-03-29 (×6): qty 0.15

## 2017-03-29 MED ORDER — INSULIN REGULAR BOLUS VIA INFUSION: 5.0000 [IU] | Freq: Once | INTRAVENOUS | Status: DC

## 2017-03-29 MED ORDER — DEXTROSE 5 % IV SOLN: INTRAVENOUS | Status: DC

## 2017-03-29 MED ORDER — PNEUMOCOCCAL VAC POLYVALENT 25 MCG/0.5ML IJ INJ
0.5000 mL | INJECTION | INTRAMUSCULAR | Status: DC
  Filled 2017-03-29: qty 0.5

## 2017-03-29 MED ORDER — POTASSIUM CHLORIDE CRYS ER 20 MEQ PO TBCR
20.0000 meq | EXTENDED_RELEASE_TABLET | Freq: Once | ORAL | Status: AC
  Administered 2017-03-29: 20 meq via ORAL
  Filled 2017-03-29: qty 1

## 2017-03-29 MED ORDER — SODIUM CHLORIDE 0.45 % IV SOLN
INTRAVENOUS | Status: DC
  Administered 2017-03-29: 11:00:00 via INTRAVENOUS

## 2017-03-29 MED ORDER — INSULIN GLARGINE 100 UNIT/ML ~~LOC~~ SOLN
10.0000 [IU] | Freq: Two times a day (BID) | SUBCUTANEOUS | Status: DC
  Administered 2017-03-29: 10 [IU] via SUBCUTANEOUS
  Filled 2017-03-29: qty 0.1

## 2017-03-29 MED ORDER — FREE WATER
300.0000 mL | Status: DC
  Administered 2017-03-29 – 2017-03-30 (×6): 300 mL

## 2017-03-29 MED ORDER — INSULIN ASPART 100 UNIT/ML ~~LOC~~ SOLN
0.0000 [IU] | Freq: Three times a day (TID) | SUBCUTANEOUS | Status: DC
  Administered 2017-03-29: 9 [IU] via SUBCUTANEOUS
  Administered 2017-03-29 (×2): 5 [IU] via SUBCUTANEOUS
  Administered 2017-03-30: 9 [IU] via SUBCUTANEOUS
  Administered 2017-03-30: 2 [IU] via SUBCUTANEOUS
  Administered 2017-03-30: 7 [IU] via SUBCUTANEOUS
  Administered 2017-03-31: 3 [IU] via SUBCUTANEOUS
  Administered 2017-03-31: 5 [IU] via SUBCUTANEOUS
  Administered 2017-03-31: 2 [IU] via SUBCUTANEOUS

## 2017-03-29 MED ORDER — INSULIN ASPART 100 UNIT/ML ~~LOC~~ SOLN: 5.0000 [IU] | Freq: Once | SUBCUTANEOUS | Status: DC

## 2017-03-29 MED ORDER — ENOXAPARIN SODIUM 40 MG/0.4ML ~~LOC~~ SOLN
40.0000 mg | SUBCUTANEOUS | Status: DC
  Administered 2017-03-29 – 2017-03-30 (×2): 40 mg via SUBCUTANEOUS
  Filled 2017-03-29 (×2): qty 0.4

## 2017-03-29 MED ORDER — INSULIN ASPART 100 UNIT/ML ~~LOC~~ SOLN
0.0000 [IU] | Freq: Every day | SUBCUTANEOUS | Status: DC
  Administered 2017-03-29 – 2017-03-30 (×2): 2 [IU] via SUBCUTANEOUS

## 2017-03-29 MED ORDER — INSULIN GLARGINE 100 UNIT/ML ~~LOC~~ SOLN
15.0000 [IU] | Freq: Once | SUBCUTANEOUS | Status: AC
  Administered 2017-03-29: 15 [IU] via SUBCUTANEOUS
  Filled 2017-03-29: qty 0.15

## 2017-03-29 NOTE — Progress Notes (Signed)
CRITICAL VALUE ALERT  Critical Value: Sodium 163  Date & Time Notied:  1315 on 03/29/17  Provider Notified: Harle StanfordSabia  Orders Received/Actions taken:

## 2017-03-29 NOTE — Consult Note (Signed)
Renal Service Consult Note [facility]  Sadie HaberStephan Mangual 03/29/2017 Maree KrabbeSCHERTZ,Lielle Vandervort D Requesting Physician:  Dr Harle StanfordSabia  Reason for Consult:  Hypernatremia HPI: The patient is a 66 y.o. year-old with hx of HTN and bladder stones presented with new DKA, BS 1500.  No hx of DM.  Now BS under control on insulin but serum Na levels are high and not improving . Asked to help with hyperNa+.    Pt denies any SOB, edema, n/v/d at this time.  Eating well today.  NO hx renal disease.   Creat 3.5 on admission w/ slow improvement to 1.3 now.    ROS  denies CP  no joint pain   no HA  no blurry vision  no rash  no diarrhea  no nausea/ vomiting  no dysuria  no difficulty voiding  no change in urine color    Past Medical History  Past Medical History:  Diagnosis Date  . Bladder stones   . HTN (hypertension)    Past Surgical History  Past Surgical History:  Procedure Laterality Date  . BLADDER STONE REMOVAL     Family History No family history on file. Social History  reports that he has never smoked. He has never used smokeless tobacco. He reports that he does not drink alcohol or use drugs. Allergies No Known Allergies Home medications Prior to Admission medications   Medication Sig Start Date End Date Taking? Authorizing Provider  losartan-hydrochlorothiazide (HYZAAR) 100-12.5 MG tablet Take 1 tablet by mouth daily. 02/28/17  Yes [provider]   Liver Function Tests  Recent Labs Lab 03/28/17 0927  AST 33  ALT 65*  ALKPHOS 99  BILITOT 1.8*  PROT 6.9  ALBUMIN 4.4    Recent Labs Lab 03/28/17 1837  LIPASE 249*   CBC  Recent Labs Lab 03/28/17 0927 03/28/17 0955  WBC 19.3*  --   NEUTROABS 18.1*  --   HGB 17.4* 18.0*  HCT 51.3 53.0*  MCV 80.3  --   PLT 244  --    Basic Metabolic Panel  Recent Labs Lab 03/28/17 0927 03/28/17 0955 03/28/17 1031 03/28/17 1837 03/28/17 2159 03/29/17 0157 03/29/17 0556 03/29/17 1212  NA 139 141 144  162* 164* 164* 163* 163*  K 4.7 4.8 5.4* 3.5 3.4* 3.7 4.0 4.1  CL 98* 103 105 130* >130* >130* 128* 127*  CO2 21*  --  16* 20* 23 23 21* 20*  GLUCOSE 1,492* >700* 1,512* 465* 200* 157* 229* 331*  BUN 58* 54* 55* 44* 39* 37* 36* 35*  CREATININE 3.48* 2.70* 3.11* 1.94* 1.55* 1.42* 1.39* 1.38*  CALCIUM 10.4*  --  10.2 9.7 9.7 9.3 9.1 9.3  PHOS  --   --  4.7*  --   --   --   --   --    Iron/TIBC/Ferritin/ %Sat No results found for: IRON, TIBC, FERRITIN, IRONPCTSAT  Vitals:   03/29/17 0200 03/29/17 0322 03/29/17 0659 03/29/17 1200  BP: (!) 140/91 128/82 108/79   Pulse: 68 79 67   Resp: 15 19 18    Temp:  97.8 F (36.6 C) 97.7 F (36.5 C) 98 F (36.7 C)  TempSrc:  Axillary Axillary Oral  SpO2: 100% 100% 100%   Weight:      Height:       Exam Gen alert, no distress No rash, cyanosis or gangrene Sclera anicteric, throat clear  No jvd or bruits Chest clear bilat RRR no MRG Abd soft ntnd no mass or ascites +bs GU normal  male MS no joint effusions or deformity Ext no LE edema / no wounds or ulcers Neuro is alert, Ox 3 , nf     Impression: 1  Hypernatremia - calc water depletion of 7.2 liters.  This is from the natural osmotic diuresis that goes along with very severe hyperglycemia.  Will try to replace orally first so as to avoid giving dextrose IV.   2  DKA - new onset DM. Per primary.  3  Renal insuff - creat 3.5 on admit, now down to 1.3.  UA showed some wbc's, o/w negative. Renal US normal kidneys 11 cm.  No further w/u at this time needed.     Plan - have ordered free H20 by mouth to replace 1/2 of water deficit over next  24 hours. Would check serum Na every 6 hrs until improved.    Vinson Moselleob Eloise Picone MD [facility] pager [phone]   03/29/2017, 1:57 PM

## 2017-03-29 NOTE — Progress Notes (Signed)
CRITICAL VALUE ALERT  Critical Value:  Sodium 162  Date & Time Notied:  1746 on 03/29/17  Provider Notified: Harle StanfordSabia  Orders Received/Actions taken: Stop 0.45NS IV

## 2017-03-29 NOTE — Progress Notes (Signed)
PROGRESS NOTE    Todd Avila  ZOX:[id number] DOB: 02/06/1951 DOA: 03/28/2017 PCP: Todd PalmerWolters, Sharon, MD     Brief Narrative:  66 year old male comes with a DKA clinical picture , new onset.  Assessment & Plan:   1-DKA : New onset missed IDDM , AG close , start Lantus 10 U BID , SSI , gentle hydration , diabetic education . 2-severe hypernatremia : in the setting of free water deficit in the setting of hyperglycemia will continue to hydrate by free water through the mouth , and half-normal saline gently at 50 ml/hour IV, BMP q6h , slow correction to prevent any  irrreversible brain injury . 3-large bladder stone : possible UTI covered empirically with Rocephin follow up with urine cultures, stop the vancomycin. 4-Acute renal injury in the setting of dehydration and taken ARB as an outpatient improved with IV fluids .   DVT prophylaxis: (Lovenox) Code Status: (Full/) Family Communication: (none) Disposition Plan: (home in 2 days    Consultants:   Nephrology  Procedures: none   Antimicrobials: Rocephin day 2  VANC stopped today   Subjective: Todd Avila is alert awake today without any complaints he complains of memory lapses and being thirsty denies any nausea or vomiting or fevers or chills or pain.  Objective: Vitals:   03/29/17 0100 03/29/17 0200 03/29/17 0322 03/29/17 0659  BP: 111/74 (!) 140/91 128/82 108/79  Pulse: 72 68 79 67  Resp: (!) 21 15 19 18   Temp:   97.8 F (36.6 C) 97.7 F (36.5 C)  TempSrc:   Axillary Axillary  SpO2: 100% 100% 100% 100%  Weight:      Height:        Intake/Output Summary (Last 24 hours) at 03/29/17 1046 Last data filed at 03/29/17 0700  Gross per 24 hour  Intake           3737.3 ml  Output             1350 ml  Net           2387.3 ml   Filed Weights   03/28/17 1115 03/28/17 1406  Weight: 74.8 kg (165 lb) 73 kg (161 lb)    Examination:  General exam: NAD. Respiratory system: Clear to auscultation. Respiratory  effort normal. Cardiovascular system: S1 & S2 heard, RRR. No JVD, murmurs, rubs, gallops or clicks. No pedal edema. Gastrointestinal system: Abdomen is nondistended, soft and nontender. No organomegaly or masses felt. Normal bowel sounds heard. Central nervous system: Alert and oriented. No focal neurological deficits. Extremities: Symmetric 5 x 5 power. Skin: No rashes, lesions or ulcers Psychiatry: Judgement and insight appear normal. Mood & affect appropriate.     Data Reviewed: I have personally reviewed following labs and imaging studies  CBC:  Recent Labs Lab 03/28/17 0927 03/28/17 0955  WBC 19.3*  --   NEUTROABS 18.1*  --   HGB 17.4* 18.0*  HCT 51.3 53.0*  MCV 80.3  --   PLT 244  --    Basic Metabolic Panel:  Recent Labs Lab 03/28/17 1031 03/28/17 1837 03/28/17 2159 03/29/17 0157 03/29/17 0556  NA 144 162* 164* 164* 163*  K 5.4* 3.5 3.4* 3.7 4.0  CL 105 130* >130* >130* 128*  CO2 16* 20* 23 23 21*  GLUCOSE 1,512* 465* 200* 157* 229*  BUN 55* 44* 39* 37* 36*  CREATININE 3.11* 1.94* 1.55* 1.42* 1.39*  CALCIUM 10.2 9.7 9.7 9.3 9.1  MG 3.7*  --   --   --   --  PHOS 4.7*  --   --   --   --    GFR: Estimated Creatinine Clearance: 49.5 mL/min (A) (by C-G formula based on SCr of 1.39 mg/dL (H)). Liver Function Tests:  Recent Labs Lab 03/28/17 0927  AST 33  ALT 65*  ALKPHOS 99  BILITOT 1.8*  PROT 6.9  ALBUMIN 4.4    Recent Labs Lab 03/28/17 1837  LIPASE 249*   No results for input(s): AMMONIA in the last 168 hours. Coagulation Profile: No results for input(s): INR, PROTIME in the last 168 hours. Cardiac Enzymes: No results for input(s): CKTOTAL, CKMB, CKMBINDEX, TROPONINI in the last 168 hours. BNP (last 3 results) No results for input(s): PROBNP in the last 8760 hours. HbA1C: No results for input(s): HGBA1C in the last 72 hours. CBG:  Recent Labs Lab 03/28/17 2126 03/28/17 2234 03/29/17 0003 03/29/17 0123 03/29/17 0813  GLUCAP 215*  155* 117* 150* 292*   Lipid Profile:  Recent Labs  03/28/17 1031  CHOL 204*  HDL 44  LDLCALC 131*  TRIG 145  CHOLHDL 4.6   Thyroid Function Tests: No results for input(s): TSH, T4TOTAL, FREET4, T3FREE, THYROIDAB in the last 72 hours. Anemia Panel: No results for input(s): VITAMINB12, FOLATE, FERRITIN, TIBC, IRON, RETICCTPCT in the last 72 hours. Urine analysis:    Component Value Date/Time   COLORURINE YELLOW 03/28/2017 0934   APPEARANCEUR CLEAR 03/28/2017 0934   LABSPEC 1.024 03/28/2017 0934   PHURINE 5.0 03/28/2017 0934   GLUCOSEU >=500 (A) 03/28/2017 0934   HGBUR LARGE (A) 03/28/2017 0934   BILIRUBINUR NEGATIVE 03/28/2017 0934   KETONESUR 5 (A) 03/28/2017 0934   PROTEINUR NEGATIVE 03/28/2017 0934   NITRITE NEGATIVE 03/28/2017 0934   LEUKOCYTESUR NEGATIVE 03/28/2017 0934   Sepsis Labs: @LABRCNTIP (procalcitonin:4,lacticidven:4)  ) Recent Results (from the past 240 hour(s))  MRSA PCR Screening     Status: None   Collection Time: 03/28/17  2:13 PM  Result Value Ref Range Status   MRSA by PCR NEGATIVE NEGATIVE Final    Comment:        The GeneXpert MRSA Assay (FDA approved for NASAL specimens only), is one component of a comprehensive MRSA colonization surveillance program. It is not intended to diagnose MRSA infection nor to guide or monitor treatment for MRSA infections.          Radiology Studies: Ct Head Wo Contrast  Result Date: 03/28/2017 CLINICAL DATA:  66 year old male with altered mental status for 3 days. EXAM: CT HEAD WITHOUT CONTRAST TECHNIQUE: Contiguous axial images were obtained from the base of the skull through the vertex without intravenous contrast. COMPARISON:  None. FINDINGS: Brain: No evidence of acute infarction, hemorrhage, hydrocephalus, extra-axial collection or mass lesion/mass effect. Mild cerebral atrophy noted. Vascular: No hyperdense vessel or unexpected calcification. Skull: Normal. Negative for fracture or focal lesion.  Sinuses/Orbits: No acute finding. Other: None. IMPRESSION: No evidence of acute intracranial abnormality. Mild atrophy. Electronically Signed   By: Harmon PierJeffrey  Hu M.D.   On: 03/28/2017 10:13   [place] Renal  Result Date: 03/29/2017 CLINICAL DATA:  Acute kidney injury EXAM: RENAL / URINARY TRACT ULTRASOUND COMPLETE COMPARISON:  None. FINDINGS: Right Kidney: Length: 11.3 cm. Echogenicity within normal limits. No mass or hydronephrosis visualized. Left Kidney: Length: 12.2 cm. Echogenicity within normal limits. No mass or hydronephrosis visualized. Bladder: Large 3 cm ovoid lesion in the lumen of the bladder with posterior shadowing is most consistent with large bladder calculus. Second smaller 1 cm calculus. IMPRESSION: 1. Normal kidneys. 2. Large bladder calculi.  Electronically Signed   By: Genevive Bi M.D.   On: 03/29/2017 09:31   Dg Chest Port 1 View  Result Date: 03/28/2017 CLINICAL DATA:  Altered mental status. EXAM: PORTABLE CHEST 1 VIEW COMPARISON:  None. FINDINGS: The cardiomediastinal silhouette is unremarkable. This is a low volume film There is no evidence of focal airspace disease, pulmonary edema, suspicious pulmonary nodule/mass, pleural effusion, or pneumothorax. No acute bony abnormalities are identified. IMPRESSION: No evidence of acute cardiopulmonary disease. Electronically Signed   By: Harmon Pier M.D.   On: 03/28/2017 10:03        Scheduled Meds: . enoxaparin (LOVENOX) injection  30 mg Subcutaneous Q24H  . insulin aspart  0-5 Units Subcutaneous QHS  . insulin aspart  0-9 Units Subcutaneous TID WC   Continuous Infusions: . sodium chloride 50 mL/hr at 03/29/17 1045  . cefTRIAXone (ROCEPHIN)  IV Stopped (03/28/17 1203)  . sodium chloride 1,000 mL (03/28/17 1648)   Or  . sodium chloride 1,000 mL (03/28/17 1711)     LOS: 1 day    Time spent: 78 MINUTES     Efrain Sella, MD [facility]   If 7PM-7AM, please contact night-coverage [email or web address] Password  [email or web address] 03/29/2017, 10:46 AM

## 2017-03-30 LAB — CBC WITH DIFFERENTIAL/PLATELET
Basophils Absolute: 0 10*3/uL (ref 0.0–0.1)
Basophils Relative: 0 %
Eosinophils Absolute: 0.1 10*3/uL (ref 0.0–0.7)
Eosinophils Relative: 0 %
HCT: 48.5 % (ref 39.0–52.0)
Hemoglobin: 16.8 g/dL (ref 13.0–17.0)
Lymphocytes Relative: 19 %
Lymphs Abs: 2.2 10*3/uL (ref 0.7–4.0)
MCH: 27.5 pg (ref 26.0–34.0)
MCHC: 34.6 g/dL (ref 30.0–36.0)
MCV: 79.2 fL (ref 78.0–100.0)
Monocytes Absolute: 0.4 10*3/uL (ref 0.1–1.0)
Monocytes Relative: 3 %
Neutro Abs: 9 10*3/uL — ABNORMAL HIGH (ref 1.7–7.7)
Neutrophils Relative %: 77 %
Platelets: 122 10*3/uL — ABNORMAL LOW (ref 150–400)
RBC: 6.12 MIL/uL — ABNORMAL HIGH (ref 4.22–5.81)
RDW: 13.7 % (ref 11.5–15.5)
WBC: 11.6 10*3/uL — ABNORMAL HIGH (ref 4.0–10.5)

## 2017-03-30 LAB — GLUCOSE, CAPILLARY
Glucose-Capillary: 164 mg/dL — ABNORMAL HIGH (ref 65–99)
Glucose-Capillary: 395 mg/dL — ABNORMAL HIGH (ref 65–99)
Glucose-Capillary: 342 mg/dL — ABNORMAL HIGH (ref 65–99)
Glucose-Capillary: 323 mg/dL — ABNORMAL HIGH (ref 65–99)
Glucose-Capillary: 248 mg/dL — ABNORMAL HIGH (ref 65–99)
Glucose-Capillary: 204 mg/dL — ABNORMAL HIGH (ref 65–99)

## 2017-03-30 LAB — BASIC METABOLIC PANEL
BUN: 27 mg/dL — ABNORMAL HIGH (ref 6–20)
CO2: 20 mmol/L — ABNORMAL LOW (ref 22–32)
Calcium: 9.3 mg/dL (ref 8.9–10.3)
Chloride: >130 mmol/L (ref 101–111)
Creatinine, Ser: 1.07 mg/dL (ref 0.61–1.24)
GFR calc Af Amer: >60 mL/min (ref >60)
GFR calc non Af Amer: >60 mL/min (ref >60)
Glucose, Bld: 171 mg/dL — ABNORMAL HIGH (ref 65–99)
Potassium: 4.1 mmol/L (ref 3.5–5.1)
Sodium: 161 mmol/L (ref 135–145)

## 2017-03-30 LAB — HEMOGLOBIN A1C
Hgb A1c MFr Bld: 14.5 % — ABNORMAL HIGH (ref 4.8–5.6)
Mean Plasma Glucose: 369 mg/dL

## 2017-03-30 MED ORDER — FREE WATER
300.0000 mL | Status: DC
  Administered 2017-03-30 – 2017-03-31 (×13): 300 mL

## 2017-03-30 MED ORDER — LIVING WELL WITH DIABETES BOOK
Freq: Once | Status: AC
  Administered 2017-03-30: 13:00:00
  Filled 2017-03-30: qty 1

## 2017-03-30 MED ORDER — DEXTROSE 5 % IV SOLN
INTRAVENOUS | Status: DC
  Administered 2017-03-30: 1000 mL via INTRAVENOUS

## 2017-03-30 MED ORDER — DEXTROSE 5 % IV SOLN
INTRAVENOUS | Status: DC
  Administered 2017-03-30 (×2): via INTRAVENOUS

## 2017-03-30 NOTE — Progress Notes (Signed)
PROGRESS NOTE    Todd Avila  ZOX:[id number] DOB: 1951/08/10 DOA: 03/28/2017 PCP: [name], MD     Brief Narrative:  66 year old male comes with a DKA clinical picture , new onset.  Assessment & Plan:   1-DKA : New onset missed IDDM , AG close ,  Lantus 15 U BID , SSI , diabetic education . 2-severe hypernatremia : in the setting of free water deficit in the setting of hyperglycemia , continue to hydrate,Nephrology started d5w , follow up with them , correct the CBG frequently . 3-large bladder stone : possible UTI covered empirically with Rocephin follow up with urine cultures, stop the vancomycin. 4-Acute renal injury in the setting of dehydration and taken ARB as an outpatient improved with IV fluids , improved.   DVT prophylaxis: (Lovenox) Code Status: (Full/) Family Communication: (none) Disposition Plan: (home in 2 days    Consultants:   Nephrology  Procedures: none   Antimicrobials: Rocephin day 3  VANC stopped today   Subjective: No complaints today , denies any fevers or chills , nausea or vomiting .  Objective: Vitals:   03/30/17 0400 03/30/17 0433 03/30/17 0624 03/30/17 0818  BP: 127/81 (!) 150/86 106/65 (!) 141/81  Pulse: (!) 57 69  70  Resp: 17 17 15    Temp:  97.7 F (36.5 C)  97.7 F (36.5 C)  TempSrc:  Axillary  Oral  SpO2: 99% 100% 99% 100%  Weight:      Height:        Intake/Output Summary (Last 24 hours) at 03/30/17 1006 Last data filed at 03/30/17 0600  Gross per 24 hour  Intake          2521.83 ml  Output              850 ml  Net          1671.83 ml   Filed Weights   03/28/17 1115 03/28/17 1406  Weight: 74.8 kg (165 lb) 73 kg (161 lb)    Examination:  General exam: NAD. Respiratory system: Clear to auscultation. Respiratory effort normal. Cardiovascular system: S1 & S2 heard, RRR. No JVD, murmurs, rubs, gallops or clicks. No pedal edema. Gastrointestinal system: Abdomen is nondistended, soft and nontender. No  organomegaly or masses felt. Normal bowel sounds heard. Central nervous system: Alert and oriented. No focal neurological deficits. Extremities: Symmetric 5 x 5 power. Skin: No rashes, lesions or ulcers Psychiatry: Judgement and insight appear normal. Mood & affect appropriate.     Data Reviewed: I have personally reviewed following labs and imaging studies  CBC:  Recent Labs Lab 03/28/17 0927 03/28/17 0955 03/30/17 0512  WBC 19.3*  --  11.6*  NEUTROABS 18.1*  --  9.0*  HGB 17.4* 18.0* 16.8  HCT 51.3 53.0* 48.5  MCV 80.3  --  79.2  PLT 244  --  122*   Basic Metabolic Panel:  Recent Labs Lab 03/28/17 1031  03/29/17 0556 03/29/17 1212 03/29/17 1647 03/29/17 2245 03/30/17 0512  NA 144  < > 163* 163* 162* 161* 161*  K 5.4*  < > 4.0 4.1 4.3 4.5 4.1  CL 105  < > 128* 127* 126* 129* >130*  CO2 16*  < > 21* 20* 22 21* 20*  GLUCOSE 1,512*  < > 229* 331* 351* 228* 171*  BUN 55*  < > 36* 35* 35* 29* 27*  CREATININE 3.11*  < > 1.39* 1.38* 1.28* 1.18 1.07  CALCIUM 10.2  < > 9.1 9.3 9.3 9.6 9.3  MG 3.7*  --   --   --   --   --   --   PHOS 4.7*  --   --   --   --   --   --   < > = values in this interval not displayed. GFR: Estimated Creatinine Clearance: 64.3 mL/min (by C-G formula based on SCr of 1.07 mg/dL). Liver Function Tests:  Recent Labs Lab 03/28/17 0927  AST 33  ALT 65*  ALKPHOS 99  BILITOT 1.8*  PROT 6.9  ALBUMIN 4.4    Recent Labs Lab 03/28/17 1837  LIPASE 249*   No results for input(s): AMMONIA in the last 168 hours. Coagulation Profile: No results for input(s): INR, PROTIME in the last 168 hours. Cardiac Enzymes: No results for input(s): CKTOTAL, CKMB, CKMBINDEX, TROPONINI in the last 168 hours. BNP (last 3 results) No results for input(s): PROBNP in the last 8760 hours. HbA1C: No results for input(s): HGBA1C in the last 72 hours. CBG:  Recent Labs Lab 03/29/17 0813 03/29/17 1143 03/29/17 1633 03/29/17 2106 03/30/17 0816  GLUCAP 292*  290* 372* 216* 164*   Lipid Profile:  Recent Labs  03/28/17 1031  CHOL 204*  HDL 44  LDLCALC 131*  TRIG 145  CHOLHDL 4.6   Thyroid Function Tests: No results for input(s): TSH, T4TOTAL, FREET4, T3FREE, THYROIDAB in the last 72 hours. Anemia Panel: No results for input(s): VITAMINB12, FOLATE, FERRITIN, TIBC, IRON, RETICCTPCT in the last 72 hours. Urine analysis:    Component Value Date/Time   COLORURINE YELLOW 03/28/2017 0934   APPEARANCEUR CLEAR 03/28/2017 0934   LABSPEC 1.024 03/28/2017 0934   PHURINE 5.0 03/28/2017 0934   GLUCOSEU >=500 (A) 03/28/2017 0934   HGBUR LARGE (A) 03/28/2017 0934   BILIRUBINUR NEGATIVE 03/28/2017 0934   KETONESUR 5 (A) 03/28/2017 0934   PROTEINUR NEGATIVE 03/28/2017 0934   NITRITE NEGATIVE 03/28/2017 0934   LEUKOCYTESUR NEGATIVE 03/28/2017 0934   Sepsis Labs: @LABRCNTIP (procalcitonin:4,lacticidven:4)  ) Recent Results (from the past 240 hour(s))  Urine culture     Status: Abnormal (Preliminary result)   Collection Time: 03/28/17  9:34 AM  Result Value Ref Range Status   Specimen Description URINE, RANDOM  Final   Special Requests NONE  Final   Culture (A)  Final    40,000 COLONIES/mL GRAM POSITIVE COCCI IDENTIFICATION TO FOLLOW    Report Status PENDING  Incomplete  MRSA PCR Screening     Status: None   Collection Time: 03/28/17  2:13 PM  Result Value Ref Range Status   MRSA by PCR NEGATIVE NEGATIVE Final    Comment:        The GeneXpert MRSA Assay (FDA approved for NASAL specimens only), is one component of a comprehensive MRSA colonization surveillance program. It is not intended to diagnose MRSA infection nor to guide or monitor treatment for MRSA infections.   Culture, blood (Routine X 2) w Reflex to ID Panel     Status: None (Preliminary result)   Collection Time: 03/28/17  6:35 PM  Result Value Ref Range Status   Specimen Description BLOOD RIGHT WRIST  Final   Special Requests IN PEDIATRIC BOTTLE Blood Culture  adequate volume  Final   Culture NO GROWTH < 24 HOURS  Final   Report Status PENDING  Incomplete  Culture, blood (Routine X 2) w Reflex to ID Panel     Status: None (Preliminary result)   Collection Time: 03/28/17  6:40 PM  Result Value Ref Range Status   Specimen Description  BLOOD RIGHT HAND  Final   Special Requests IN PEDIATRIC BOTTLE Blood Culture adequate volume  Final   Culture NO GROWTH < 24 HOURS  Final   Report Status PENDING  Incomplete         Radiology Studies: Ct Head Wo Contrast  Result Date: 03/28/2017 CLINICAL DATA:  66 year old male with altered mental status for 3 days. EXAM: CT HEAD WITHOUT CONTRAST TECHNIQUE: Contiguous axial images were obtained from the base of the skull through the vertex without intravenous contrast. COMPARISON:  None. FINDINGS: Brain: No evidence of acute infarction, hemorrhage, hydrocephalus, extra-axial collection or mass lesion/mass effect. Mild cerebral atrophy noted. Vascular: No hyperdense vessel or unexpected calcification. Skull: Normal. Negative for fracture or focal lesion. Sinuses/Orbits: No acute finding. Other: None. IMPRESSION: No evidence of acute intracranial abnormality. Mild atrophy. Electronically Signed   By: Harmon Pier M.D.   On: 03/28/2017 10:13   US Renal  Result Date: 03/29/2017 CLINICAL DATA:  Acute kidney injury EXAM: RENAL / URINARY TRACT ULTRASOUND COMPLETE COMPARISON:  None. FINDINGS: Right Kidney: Length: 11.3 cm. Echogenicity within normal limits. No mass or hydronephrosis visualized. Left Kidney: Length: 12.2 cm. Echogenicity within normal limits. No mass or hydronephrosis visualized. Bladder: Large 3 cm ovoid lesion in the lumen of the bladder with posterior shadowing is most consistent with large bladder calculus. Second smaller 1 cm calculus. IMPRESSION: 1. Normal kidneys. 2. Large bladder calculi. Electronically Signed   By: Genevive Bi M.D.   On: 03/29/2017 09:31        Scheduled Meds: . enoxaparin  (LOVENOX) injection  40 mg Subcutaneous Q24H  . insulin aspart  0-5 Units Subcutaneous QHS  . insulin aspart  0-9 Units Subcutaneous TID WC  . insulin aspart  5 Units Subcutaneous TID WC  . insulin aspart  5 Units Intravenous Once  . insulin glargine  15 Units Subcutaneous BID  . pneumococcal 23 valent vaccine  0.5 mL Intramuscular Tomorrow-1000   Continuous Infusions: . cefTRIAXone (ROCEPHIN)  IV Stopped (03/29/17 1232)  . dextrose 150 mL/hr at 03/30/17 0835     LOS: 2 days    Time spent: 29 MINUTES     Efrain Sella, MD [facility]   If 7PM-7AM, please contact night-coverage [email or web address] Password [email or web address] 03/30/2017, 10:06 AM

## 2017-03-30 NOTE — Progress Notes (Signed)
[facility] Rounding Note  Subjective/Interval History:  Pt was not compliant with water intake I told nurses doesn't have to be "plain water" just electrolyte free and that diet sprite, ginger ale, etc would be OK  Objective Vital signs in last 24 hours: Vitals:   03/30/17 0624 03/30/17 0818 03/30/17 1200 03/30/17 1248  BP: 106/65 (!) 141/81 122/73   Pulse:  70 64   Resp: 15  19   Temp:  97.7 F (36.5 C)  98.3 F (36.8 C)  TempSrc:  Oral  Oral  SpO2: 99% 100% 97%   Weight:      Height:       Weight change:   Intake/Output Summary (Last 24 hours) at 03/30/17 1404 Last data filed at 03/30/17 1300  Gross per 24 hour  Intake          2377.66 ml  Output              820 ml  Net          1557.66 ml   Physical Exam:  Blood pressure 122/73, pulse 64, temperature 98.3 F (36.8 C), temperature source Oral, resp. rate 19, height 5\' 7"  (1.702 m), weight 73 kg (161 lb), SpO2 97 %.   Gen alert, no distress No jvd or bruits Chest clear bilat RRR no MRG Abd soft ntnd  +bs Ext no LE edema Neuro is alert, Ox 3 , nf  Labs:   Recent Labs Lab 03/28/17 1031  03/28/17 2159 03/29/17 0157 03/29/17 0556 03/29/17 1212 03/29/17 1647 03/29/17 2245 03/30/17 0512  NA 144  < > 164* 164* 163* 163* 162* 161* 161*  K 5.4*  < > 3.4* 3.7 4.0 4.1 4.3 4.5 4.1  CL 105  < > >130* >130* 128* 127* 126* 129* >130*  CO2 16*  < > 23 23 21* 20* 22 21* 20*  GLUCOSE 1,512*  < > 200* 157* 229* 331* 351* 228* 171*  BUN 55*  < > 39* 37* 36* 35* 35* 29* 27*  CREATININE 3.11*  < > 1.55* 1.42* 1.39* 1.38* 1.28* 1.18 1.07  CALCIUM 10.2  < > 9.7 9.3 9.1 9.3 9.3 9.6 9.3  PHOS 4.7*  --   --   --   --   --   --   --   --   < > = values in this interval not displayed.   Recent Labs Lab 03/28/17 0927  AST 33  ALT 65*  ALKPHOS 99  BILITOT 1.8*  PROT 6.9  ALBUMIN 4.4    Recent Labs Lab 03/28/17 1837  LIPASE 249*     Recent Labs Lab 03/28/17 0927 03/28/17 0955 03/30/17 0512  WBC 19.3*  --  11.6*   NEUTROABS 18.1*  --  9.0*  HGB 17.4* 18.0* 16.8  HCT 51.3 53.0* 48.5  MCV 80.3  --  79.2  PLT 244  --  122*    Recent Labs Lab 03/29/17 1143 03/29/17 1633 03/29/17 2106 03/30/17 0816 03/30/17 1246  GLUCAP 290* 372* 216* 164* 395*   Studies/Results: US Renal  Result Date: 03/29/2017 CLINICAL DATA:  Acute kidney injury EXAM: RENAL / URINARY TRACT ULTRASOUND COMPLETE COMPARISON:  None. FINDINGS: Right Kidney: Length: 11.3 cm. Echogenicity within normal limits. No mass or hydronephrosis visualized. Left Kidney: Length: 12.2 cm. Echogenicity within normal limits. No mass or hydronephrosis visualized. Bladder: Large 3 cm ovoid lesion in the lumen of the bladder with posterior shadowing is most consistent with large bladder calculus. Second smaller 1 cm  calculus. IMPRESSION: 1. Normal kidneys. 2. Large bladder calculi. Electronically Signed   By: Genevive BiStewart  Edmunds M.D.   On: 03/29/2017 09:31   Medications: . cefTRIAXone (ROCEPHIN)  IV Stopped (03/30/17 1150)  . dextrose 150 mL/hr at 03/30/17 1332   . enoxaparin (LOVENOX) injection  40 mg Subcutaneous Q24H  . insulin aspart  0-5 Units Subcutaneous QHS  . insulin aspart  0-9 Units Subcutaneous TID WC  . insulin aspart  5 Units Subcutaneous TID WC  . insulin aspart  5 Units Intravenous Once  . insulin glargine  15 Units Subcutaneous BID  . pneumococcal 23 valent vaccine  0.5 mL Intramuscular Tomorrow-1000    Background:  66 y.o. year-old with hx of HTN and bladder stones presented with new DKA, BS 1500.  No hx of DM.  BS controlled with insulin, but development of severe hypernatremia not improving w/Na 160's and renal asked to help. AKI on admission w/creatinine as high as 3.5 normalizing.  Assessment/Recommendations  1. Hypernatremia - initial calculated free water deficit of 7.2 liters 2/2 osmotic diuresis that goes along with very severe hyperglycemia.  Tried to replace orally first so as to avoid giving dextrose IV. Free H20 by  mouth was ordered 8/5 to replace 1/2 of water deficit over 24 hours. Na falling very slowly. Pt not compliant with it. D5W added back by Dr. Arlean HoppingSchertz this AM at 150/hour. Now BS up again.Resume electrolyte free water (diet sodas OK) 300cc every 4 hours in addition to the D5W. Would continue to check serum Na every 6 hrs until improved.  2. DKA - new onset DM. Per primary. 3. Renal insuff - creat 3.5 on admit, now down to 1.3.  UA showed some wbc's, o/w negative. Renal US normal kidneys11 cm.  No further w/u at this time needed.    Camille Balynthia Tasmine Hipwell, MD [facility] [phone] pager 03/30/2017, 2:04 PM

## 2017-03-30 NOTE — Progress Notes (Addendum)
Pts Na still 161 @ 0500.  will continue w/ free water. Messaged nephrology. Per nephrology, increase D5W to 17050ml/hr and d/c free water.  Will increase insulin coverage if needed

## 2017-03-30 NOTE — Progress Notes (Signed)
CRITICAL VALUE ALERT  Critical Value:  Sodium 161  Date & Time Notied: 03/30/17 0618  Provider Notified: J.Kim, MD  Orders Received/Actions taken: No new orders, MD aware

## 2017-03-30 NOTE — Progress Notes (Signed)
CRITICAL VALUE ALERT  Critical Value:  Sodium 161  Date & Time Notied: 03/30/2017 0008  Provider Notified: Selena BattenKim  Orders Received/Actions taken: D5W@40ml /hr x 12hrs. Aware of patient not drinking 300ml free water q2hr, will continue to encourage him to drink per MD

## 2017-03-30 NOTE — Care Management Note (Signed)
Case Management Note  Patient Details  Name: Todd Avila MRN: [id number] Date of Birth: 03/09/1951  Subjective/Objective:   From home with a friend,pta indep, [place] from [place] , [place] decided to stay her in [place].  He has [employer] insurance, he came in thru ED, NCM will take information to ED to put into system.  He presents with new onset DM and DKA.  He states his address is [address] [place] [id number]. He has PCP Mila PalmerSharon Wolters.                  Action/Plan: NCM will follow for dc needs.   Expected Discharge Date:                  Expected Discharge Plan:  Home/Self Care  In-House Referral:     Discharge planning Services  CM Consult  Post Acute Care Choice:    Choice offered to:     DME Arranged:    DME Agency:     HH Arranged:    HH Agency:     Status of Service:  In process, will continue to follow  If discussed at Long Length of Stay Meetings, dates discussed:    Additional Comments:  Leone Havenaylor, Acey [place], RN 03/30/2017, 12:47 PM

## 2017-03-30 NOTE — Progress Notes (Signed)
  Per nephrology, d/c D5 drip and no need for insulin drip. Will continue to monitor CBG closely.

## 2017-03-30 NOTE — Progress Notes (Signed)
Pts CBG is 342. Novolog not scheduled until 1700. Per nephrology, keep D5 going until Na decreases. Messaged Dr. Harle StanfordSabia

## 2017-03-30 NOTE — Progress Notes (Signed)
Inpatient Diabetes Program Recommendations  AACE/ADA: New Consensus Statement on Inpatient Glycemic Control (2015)  Target Ranges:  Prepandial:   less than 140 mg/dL      Peak postprandial:   less than 180 mg/dL (1-2 hours)      Critically ill patients:  140 - 180 mg/dL   Results for Todd Avila, Todd Avila (MRN [id number]) as of 03/30/2017 14:43  Ref. Range 03/28/2017 10:31  Hemoglobin A1C Latest Ref Range: 4.8 - 5.6 % 14.5 (H)   Results for Todd Avila, Todd Avila (MRN [id number]) as of 03/30/2017 14:43  Ref. Range 03/29/2017 08:13 03/29/2017 11:43 03/29/2017 16:33 03/29/2017 21:06  Glucose-Capillary Latest Ref Range: 65 - 99 mg/dL 782292 (H) 956290 (H) 213372 (H) 216 (H)   Results for Todd Avila, Todd Avila (MRN [id number]) as of 03/30/2017 14:43  Ref. Range 03/30/2017 08:16 03/30/2017 12:46  Glucose-Capillary Latest Ref Range: 65 - 99 mg/dL 629164 (H) 528395 (H)    Admit with: DKA, type 2/New onset type 2 diabetes mellitus   Current Insulin Orders: Lantus 15 units BID      Novolog Sensitive Correction Scale/ SSI (0-9 units) TID AC + HS      Novolog 5 units TID with meals     MD- Would patient benefit from restart of IV insulin drip to help with electrolyte imbalances??  If IV Insulin drip not restarted, may consider increasing Novolog Meal Coverage to 7 units TID with meals (hold if pt eats <50% of meal)    Spoke with pt and his girlfriend (and other family members) about new diagnosis.  Discussed A1C results with them and explained what an A1C is, basic pathophysiology of DM Type 2, basic home care, basic diabetes diet nutrition principles, importance of checking CBGs and maintaining good CBG control to prevent long-term and short-term complications.  Reviewed signs and symptoms of hyperglycemia and hypoglycemia and how to treat hyperglycemia and hypoglycemia at home.  Also reviewed blood sugar goals and A1c goals for home.    Also discussed DM diet information with patient.  Encouraged patient to avoid beverages with sugar  (regular soda, sweet tea, lemonade, fruit juice) and to consume mostly water.  Discussed what foods contain carbohydrates and how carbohydrates affect the body's blood sugar levels.  Encouraged patient to be careful with his portion sizes (especially grains, starchy vegetables, and fruits).  Explained to patient that men should have 60-75 grams of carbohydrates per meal per day.  RNs to provide ongoing basic DM education at bedside with this patient.  Have ordered educational booklet and CBG meter teaching for pt.  Have also placed RD consult for DM diet education for this patient.  Suspect patient will need Insulin at time of d/c.  Will have RNs begin insulin teaching at bedside.    --Will follow patient during hospitalization--  Ambrose FinlandJeannine Johnston Broughton Eppinger RN, MSN, CDE Diabetes Coordinator Inpatient Glycemic Control Team Team Pager: [phone] (8a-5p)

## 2017-03-31 LAB — GLUCOSE, CAPILLARY
Glucose-Capillary: 188 mg/dL — ABNORMAL HIGH (ref 65–99)
Glucose-Capillary: 181 mg/dL — ABNORMAL HIGH (ref 65–99)
Glucose-Capillary: 155 mg/dL — ABNORMAL HIGH (ref 65–99)
Glucose-Capillary: 338 mg/dL — ABNORMAL HIGH (ref 65–99)
Glucose-Capillary: 260 mg/dL — ABNORMAL HIGH (ref 65–99)

## 2017-03-31 LAB — URINE CULTURE: Culture: 40000 — AB

## 2017-03-31 LAB — CBC
HCT: 40.1 % (ref 39.0–52.0)
Hemoglobin: 13.2 g/dL (ref 13.0–17.0)
MCH: 25.9 pg — ABNORMAL LOW (ref 26.0–34.0)
MCHC: 32.9 g/dL (ref 30.0–36.0)
MCV: 78.8 fL (ref 78.0–100.0)
Platelets: 102 10*3/uL — ABNORMAL LOW (ref 150–400)
RBC: 5.09 MIL/uL (ref 4.22–5.81)
RDW: 12.9 % (ref 11.5–15.5)
WBC: 7.1 10*3/uL (ref 4.0–10.5)

## 2017-03-31 LAB — COMPREHENSIVE METABOLIC PANEL
ALT: 83 U/L — ABNORMAL HIGH (ref 17–63)
AST: 90 U/L — ABNORMAL HIGH (ref 15–41)
Albumin: 2.7 g/dL — ABNORMAL LOW (ref 3.5–5.0)
Alkaline Phosphatase: 55 U/L (ref 38–126)
Anion gap: 7 (ref 5–15)
BUN: 19 mg/dL (ref 6–20)
CO2: 27 mmol/L (ref 22–32)
Calcium: 8.8 mg/dL — ABNORMAL LOW (ref 8.9–10.3)
Chloride: 117 mmol/L — ABNORMAL HIGH (ref 101–111)
Creatinine, Ser: 0.93 mg/dL (ref 0.61–1.24)
GFR calc Af Amer: >60 mL/min (ref >60)
GFR calc non Af Amer: >60 mL/min (ref >60)
Glucose, Bld: 160 mg/dL — ABNORMAL HIGH (ref 65–99)
Potassium: 3.6 mmol/L (ref 3.5–5.1)
Sodium: 151 mmol/L — ABNORMAL HIGH (ref 135–145)
Total Bilirubin: 1.4 mg/dL — ABNORMAL HIGH (ref 0.3–1.2)
Total Protein: 4.5 g/dL — ABNORMAL LOW (ref 6.5–8.1)

## 2017-03-31 MED ORDER — CIPROFLOXACIN HCL 500 MG PO TABS: 500.0000 mg | ORAL_TABLET | Freq: Two times a day (BID) | ORAL | 0 refills | Status: DC

## 2017-03-31 MED ORDER — INSULIN STARTER KIT- SYRINGES (ENGLISH)
1.0000 | Freq: Once | Status: AC
  Administered 2017-03-31: 1
  Filled 2017-03-31: qty 1

## 2017-03-31 MED ORDER — INSULIN GLARGINE 100 UNIT/ML SOLOSTAR PEN: 15.0000 [IU] | PEN_INJECTOR | Freq: Two times a day (BID) | SUBCUTANEOUS | 11 refills | Status: DC

## 2017-03-31 MED ORDER — INSULIN STARTER KIT- SYRINGES (ENGLISH): 1.0000 | Freq: Once | 0 refills | Status: AC

## 2017-03-31 MED ORDER — INSULIN ASPART 100 UNIT/ML ~~LOC~~ SOLN: 5.0000 [IU] | Freq: Three times a day (TID) | SUBCUTANEOUS | 11 refills | Status: DC

## 2017-03-31 NOTE — Progress Notes (Signed)
Met with patient and his girlfriend Pamala Hurry to discuss insulin pen and syringe administration. Reviewed the storage, shelf life, dosing, site selection, and administration of both the insulin pen and the insulin syringe.  Teach back method used to confirm patient knowledge. Reviewed the types of insulin (Lantus and Novolog) and the timing of insulin.  Insulin literature with pictures given to the patient as a reference for insulin administration.      Reviewed general meal planning- patient was able to successfully plan a couple of meals using the plate method for meal planning. Stressed the need to eat 3 balanced meals as he will be giving insulin at each meal.  Reviewed the Living Well with Diabetes booklet and encouraged patient to call [place] line, [facility] staff,  his MD, & the 1-800 number on his glucometer if he has questions after discharge.  Gentry Fitz, RN, BA, MHA, CDE Diabetes Coordinator Inpatient Diabetes Program  [phone] (Team Pager) [phone] ([place]) 03/31/2017 4:21 PM

## 2017-03-31 NOTE — Plan of Care (Signed)
Problem: Education: Goal: Ability to describe self-care measures that may prevent or decrease complications (Diabetes Survival Skills Education) will improve Outcome: Progressing Patient demonstrated ability to self inject insulin twice today.

## 2017-03-31 NOTE — Consult Note (Signed)
   [facility] CM Inpatient Consult   03/31/2017  Todd Avila July 26, 1951 [id number]  Consult received from inpatient [name] patient would benefit from [place] Management for new diabetes post hospital follow up in the [place]. Met with the patient at the bedside with Diabetes Coordinator, Almyra Free. Explained [facility] Care Management services for education and resource needs. Patient verbalized understanding.  Consent form signed.  Patient endorses Dr. Audley Hose at [facility] as his primary care provider.  This provider provides the transition of care follow up for their patient. Inpatient RNCM is working to get home health RN for follow up as well with [facility].  [facility] Care Management does not interfere with or eliminate any services arranged by the inpatient care management staff.  For questions, please contact:  Natividad Brood, RN BSN [facility] Liaison  [phone] business mobile phone Toll free office [phone]

## 2017-03-31 NOTE — Discharge Summary (Signed)
Physician Discharge Summary  Todd Avila PIR:[id number] DOB: 1950-09-03 DOA: 03/28/2017  PCP: Jonathon Jordan, MD  Admit date: 03/28/2017 Discharge date: 03/31/2017  Admitted From: Home Disposition:  Home   Recommendations for Outpatient Follow-up:  1. Follow up with PCP in 1 WEEK . 2. FOLLOW up with your urology soon .  Home Health:yes Equipment/Devices: NO  Discharge Condition:STABLE  CODE STATUS:FULL  Diet recommendation: Heart Healthy / Carb Modified    Brief/Interim Summary:   66 y.o. year-old with hx of HTN and bladder stones presented with new DKA, BS 1500.  No hx of DM , what looks like new onset missed IDDM , admitted to the step down and and Insulin drip initiated with subsequent improvement in his CBG and GAP closed , Lantus initiated and SSI , CBG improved , hypernatremia and free water deficit corrected with oral hydration , patient improved significantly and Diabetic education provided to him with prescription .    Discharge Diagnoses:      1-DKA : New onset missed IDDM , AG close ,  Lantus 15 U BID , SSI , diabetic education . 2-severe hypernatremia : in the setting of free water deficit in the setting of hyperglycemia , improved with oral hydration . 3-large bladder stone : possible led to  UTI covered empirically with Rocephin , improved , switched to oral Cipro and follow up with urology for stone removal. 4-Acute renal injury in the setting of dehydration and taken ARB as an outpatient improved with IV fluids , improved.    Discharge Instructions  Discharge Instructions    Ambulatory referral to Nutrition and Diabetic Education    Complete by:  As directed    New diagnosis of DM.  A1c 14.5%.  Likely to d/c home on insulin.  PCP: Dr. Jonathon Jordan with [facility].  Thanks!     Allergies as of 03/31/2017   No Known Allergies     Medication List    TAKE these medications   ciprofloxacin 500 MG tablet Commonly known as:  CIPRO Take 1 tablet (500 mg  total) by mouth 2 (two) times daily.   insulin aspart 100 UNIT/ML injection Commonly known as:  novoLOG Inject 5-10 Units into the skin 3 (three) times daily with meals.   insulin aspart 100 UNIT/ML injection Commonly known as:  novoLOG Inject 5 Units into the skin 3 (three) times daily with meals.   Insulin Glargine 100 UNIT/ML Solostar Pen Commonly known as:  LANTUS SOLOSTAR Inject 15 Units into the skin 2 (two) times daily at 8 am and 10 pm.   insulin starter kit- syringes Misc 1 kit by Other route once.   losartan-hydrochlorothiazide 100-12.5 MG tablet Commonly known as:  HYZAAR Take 1 tablet by mouth daily.       No Known Allergies  Consultations:  Nephrology.   Procedures/Studies: Ct Head Wo Contrast  Result Date: 03/28/2017 CLINICAL DATA:  66 year old male with altered mental status for 3 days. EXAM: CT HEAD WITHOUT CONTRAST TECHNIQUE: Contiguous axial images were obtained from the base of the skull through the vertex without intravenous contrast. COMPARISON:  None. FINDINGS: Brain: No evidence of acute infarction, hemorrhage, hydrocephalus, extra-axial collection or mass lesion/mass effect. Mild cerebral atrophy noted. Vascular: No hyperdense vessel or unexpected calcification. Skull: Normal. Negative for fracture or focal lesion. Sinuses/Orbits: No acute finding. Other: None. IMPRESSION: No evidence of acute intracranial abnormality. Mild atrophy. Electronically Signed   By: Margarette Canada M.D.   On: 03/28/2017 10:13   US Renal  Result  Date: 03/29/2017 CLINICAL DATA:  Acute kidney injury EXAM: RENAL / URINARY TRACT ULTRASOUND COMPLETE COMPARISON:  None. FINDINGS: Right Kidney: Length: 11.3 cm. Echogenicity within normal limits. No mass or hydronephrosis visualized. Left Kidney: Length: 12.2 cm. Echogenicity within normal limits. No mass or hydronephrosis visualized. Bladder: Large 3 cm ovoid lesion in the lumen of the bladder with posterior shadowing is most consistent with  large bladder calculus. Second smaller 1 cm calculus. IMPRESSION: 1. Normal kidneys. 2. Large bladder calculi. Electronically Signed   By: Suzy Bouchard M.D.   On: 03/29/2017 09:31   Dg Chest Port 1 View  Result Date: 03/28/2017 CLINICAL DATA:  Altered mental status. EXAM: PORTABLE CHEST 1 VIEW COMPARISON:  None. FINDINGS: The cardiomediastinal silhouette is unremarkable. This is a low volume film There is no evidence of focal airspace disease, pulmonary edema, suspicious pulmonary nodule/mass, pleural effusion, or pneumothorax. No acute bony abnormalities are identified. IMPRESSION: No evidence of acute cardiopulmonary disease. Electronically Signed   By: Margarette Canada M.D.   On: 03/28/2017 10:03    (Echo, Carotid, EGD, Colonoscopy, ERCP)    Subjective:   Discharge Exam: Vitals:   03/31/17 0400 03/31/17 0745  BP: 129/85 (!) 141/78  Pulse: 70   Resp: 12   Temp:  98.3 F (36.8 C)   Vitals:   03/31/17 0000 03/31/17 0348 03/31/17 0400 03/31/17 0745  BP: 110/73 95/69 129/85 (!) 141/78  Pulse: (!) 58 (!) 54 70   Resp: '18 13 12   ' Temp:  98.3 F (36.8 C)  98.3 F (36.8 C)  TempSrc:  Oral  Oral  SpO2: 98% 98% 100%   Weight:      Height:        General: Pt is alert, awake, not in acute distress Cardiovascular: RRR, S1/S2 +, no rubs, no gallops Respiratory: CTA bilaterally, no wheezing, no rhonchi Abdominal: Soft, NT, ND, bowel sounds + Extremities: no edema, no cyanosis    The results of significant diagnostics from this hospitalization (including imaging, microbiology, ancillary and laboratory) are listed below for reference.     Microbiology: Recent Results (from the past 240 hour(s))  Urine culture     Status: Abnormal   Collection Time: 03/28/17  9:34 AM  Result Value Ref Range Status   Specimen Description URINE, RANDOM  Final   Special Requests NONE  Final   Culture (A)  Final    40,000 COLONIES/mL AEROCOCCUS URINAE Standardized susceptibility testing for this  organism is not available.    Report Status 03/31/2017 FINAL  Final  MRSA PCR Screening     Status: None   Collection Time: 03/28/17  2:13 PM  Result Value Ref Range Status   MRSA by PCR NEGATIVE NEGATIVE Final    Comment:        The GeneXpert MRSA Assay (FDA approved for NASAL specimens only), is one component of a comprehensive MRSA colonization surveillance program. It is not intended to diagnose MRSA infection nor to guide or monitor treatment for MRSA infections.   Culture, blood (Routine X 2) w Reflex to ID Panel     Status: None (Preliminary result)   Collection Time: 03/28/17  6:35 PM  Result Value Ref Range Status   Specimen Description BLOOD RIGHT WRIST  Final   Special Requests IN PEDIATRIC BOTTLE Blood Culture adequate volume  Final   Culture NO GROWTH 2 DAYS  Final   Report Status PENDING  Incomplete  Culture, blood (Routine X 2) w Reflex to ID Panel  Status: None (Preliminary result)   Collection Time: 03/28/17  6:40 PM  Result Value Ref Range Status   Specimen Description BLOOD RIGHT HAND  Final   Special Requests IN PEDIATRIC BOTTLE Blood Culture adequate volume  Final   Culture NO GROWTH 2 DAYS  Final   Report Status PENDING  Incomplete     Labs: BNP (last 3 results) No results for input(s): BNP in the last 8760 hours. Basic Metabolic Panel:  Recent Labs Lab 03/28/17 1031  03/29/17 1212 03/29/17 1647 03/29/17 2245 03/30/17 0512 03/31/17 0535  NA 144  < > 163* 162* 161* 161* 151*  K 5.4*  < > 4.1 4.3 4.5 4.1 3.6  CL 105  < > 127* 126* 129* >130* 117*  CO2 16*  < > 20* 22 21* 20* 27  GLUCOSE 1,512*  < > 331* 351* 228* 171* 160*  BUN 55*  < > 35* 35* 29* 27* 19  CREATININE 3.11*  < > 1.38* 1.28* 1.18 1.07 0.93  CALCIUM 10.2  < > 9.3 9.3 9.6 9.3 8.8*  MG 3.7*  --   --   --   --   --   --   PHOS 4.7*  --   --   --   --   --   --   < > = values in this interval not displayed. Liver Function Tests:  Recent Labs Lab 03/28/17 0927  03/31/17 0535  AST 33 90*  ALT 65* 83*  ALKPHOS 99 55  BILITOT 1.8* 1.4*  PROT 6.9 4.5*  ALBUMIN 4.4 2.7*    Recent Labs Lab 03/28/17 1837  LIPASE 249*   No results for input(s): AMMONIA in the last 168 hours. CBC:  Recent Labs Lab 03/28/17 0927 03/28/17 0955 03/30/17 0512 03/31/17 0535  WBC 19.3*  --  11.6* 7.1  NEUTROABS 18.1*  --  9.0*  --   HGB 17.4* 18.0* 16.8 13.2  HCT 51.3 53.0* 48.5 40.1  MCV 80.3  --  79.2 78.8  PLT 244  --  122* 102*   Cardiac Enzymes: No results for input(s): CKTOTAL, CKMB, CKMBINDEX, TROPONINI in the last 168 hours. BNP: Invalid input(s): POCBNP CBG:  Recent Labs Lab 03/30/17 1947 03/30/17 2126 03/31/17 0046 03/31/17 0347 03/31/17 0751  GLUCAP 248* 204* 188* 181* 155*   D-Dimer No results for input(s): DDIMER in the last 72 hours. Hgb A1c No results for input(s): HGBA1C in the last 72 hours. Lipid Profile No results for input(s): CHOL, HDL, LDLCALC, TRIG, CHOLHDL, LDLDIRECT in the last 72 hours. Thyroid function studies No results for input(s): TSH, T4TOTAL, T3FREE, THYROIDAB in the last 72 hours.  Invalid input(s): FREET3 Anemia work up No results for input(s): VITAMINB12, FOLATE, FERRITIN, TIBC, IRON, RETICCTPCT in the last 72 hours. Urinalysis    Component Value Date/Time   COLORURINE YELLOW 03/28/2017 [place] 03/28/2017 0934   LABSPEC 1.024 03/28/2017 0934   PHURINE 5.0 03/28/2017 0934   GLUCOSEU >=500 (A) 03/28/2017 0934   HGBUR LARGE (A) 03/28/2017 0934   BILIRUBINUR NEGATIVE 03/28/2017 0934   KETONESUR 5 (A) 03/28/2017 0934   PROTEINUR NEGATIVE 03/28/2017 0934   NITRITE NEGATIVE 03/28/2017 0934   LEUKOCYTESUR NEGATIVE 03/28/2017 0934   Sepsis Labs Invalid input(s): PROCALCITONIN,  WBC,  LACTICIDVEN Microbiology Recent Results (from the past 240 hour(s))  Urine culture     Status: Abnormal   Collection Time: 03/28/17  9:34 AM  Result Value Ref Range Status   Specimen Description URINE,  RANDOM  Final   Special Requests NONE  Final   Culture (A)  Final    40,000 COLONIES/mL AEROCOCCUS URINAE Standardized susceptibility testing for this organism is not available.    Report Status 03/31/2017 FINAL  Final  MRSA PCR Screening     Status: None   Collection Time: 03/28/17  2:13 PM  Result Value Ref Range Status   MRSA by PCR NEGATIVE NEGATIVE Final    Comment:        The GeneXpert MRSA Assay (FDA approved for NASAL specimens only), is one component of a comprehensive MRSA colonization surveillance program. It is not intended to diagnose MRSA infection nor to guide or monitor treatment for MRSA infections.   Culture, blood (Routine X 2) w Reflex to ID Panel     Status: None (Preliminary result)   Collection Time: 03/28/17  6:35 PM  Result Value Ref Range Status   Specimen Description BLOOD RIGHT WRIST  Final   Special Requests IN PEDIATRIC BOTTLE Blood Culture adequate volume  Final   Culture NO GROWTH 2 DAYS  Final   Report Status PENDING  Incomplete  Culture, blood (Routine X 2) w Reflex to ID Panel     Status: None (Preliminary result)   Collection Time: 03/28/17  6:40 PM  Result Value Ref Range Status   Specimen Description BLOOD RIGHT HAND  Final   Special Requests IN PEDIATRIC BOTTLE Blood Culture adequate volume  Final   Culture NO GROWTH 2 DAYS  Final   Report Status PENDING  Incomplete     Time coordinating discharge: Over 30 minutes  SIGNED:   Waldron Session, MD  [facility] 03/31/2017, 10:52 AM Pager   If 7PM-7AM, please contact night-coverage [email or web address] Password [email or web address]

## 2017-03-31 NOTE — Progress Notes (Signed)
Inpatient Diabetes Program Recommendations  AACE/ADA: New Consensus Statement on Inpatient Glycemic Control (2015)  Target Ranges:  Prepandial:   less than 140 mg/dL      Peak postprandial:   less than 180 mg/dL (1-2 hours)      Critically ill patients:  140 - 180 mg/dL   Lab Results  Component Value Date   GLUCAP 155 (H) 03/31/2017   HGBA1C 14.5 (H) 03/28/2017   Results for BRAIDAN, RICCIARDI (MRN [id number]) as of 03/31/2017 08:33  Ref. Range 03/30/2017 19:47 03/30/2017 21:26 03/31/2017 00:46 03/31/2017 03:47 03/31/2017 07:51  Glucose-Capillary Latest Ref Range: 65 - 99 mg/dL 248 (H) 204 (H) 188 (H) 181 (H) 155 (H)    Admit with: DKA, type 2/New onset type 2 diabetes mellitus   Current Insulin Orders: Lantus 15 units BID                                       Novolog Sensitive Correction Scale 0-9 tid, Novolog 0-5 units qhs                                       Novolog 5 units TID with meals   Consider increasing Novolog Meal Coverage to 7 units TID with meals (hold if pt eats <50% of meal)   RNs to provide ongoing basic DM education at bedside with this patient.  Educational booklet ordered yesterday and RD consult for DM diet education for this patient.  Suspect patient will need Insulin at time of d/c.  Please begin insulin teaching and having patient watch videos 506 and 510- please order insulin pen or insulin syringe teaching kit through "[facility] [employer]" to begin teaching.   Consult noted- will follow up with patient today.   Gentry Fitz, RN, BA, MHA, CDE Diabetes Coordinator Inpatient Diabetes Program  [phone] (Team Pager) [phone] ([place]) 03/31/2017 8:33 AM

## 2017-03-31 NOTE — Progress Notes (Signed)
Discharge Note:  Patient alert and oriented X 4 and in no distress.  Patient and his significant other were given discharge instructions regarding signs and symptoms to report, diabetes management, medication, diet, activity, and upcoming appointments. They verbalized understanding of all instructions. Peripheral IV and telemetry discontinued.  Patient confirmed that he had all of his personal belongings.  He was transported out via wheelchair by NT.

## 2017-03-31 NOTE — Care Management Note (Addendum)
Case Management Note  Patient Details  Name: Todd Avila MRN: [id number] Date of Birth: 09/14/1950  Subjective/Objective:    From home with a friend,pta indep, [place] from [place] , [place] decided to stay [place] in [place].  He has [employer] insurance, he came in thru ED, NCM will take information to ED to put into system.  He presents with new onset DM and DKA.  He states [place] is [address] [place] [id number]. He has PCP Mila PalmerSharon Wolters. [facility], referral made .  Also [facility].  NCM [facility] [place] [facility].  NCM  informed patient that he will have a $25  Co pay,he states that is fine. Soc will begin 24-48 hrs post dc.                             Action/Plan: NCM will follow for dc needs.  Expected Discharge Date:                  Expected Discharge Plan:  Home w Home Health Services  [facility]  Discharge planning Services  CM Consult  Post Acute Care Choice:  Home Health Choice offered to:  Patient  DME Arranged:  Glucometer, Diabetic Supplies DME Agency:     HH Arranged:  RN HH Agency:  Other - See comment  Status of Service:  In process, will continue to follow  If discussed at Long Length of Stay Meetings, dates discussed:    Additional Comments:  Leone Havenaylor, Kieon Lawhorn [place], RN 03/31/2017, 3:18 PM

## 2017-03-31 NOTE — Plan of Care (Signed)
Problem: Food- and Nutrition-Related Knowledge Deficit (NB-1.1) Goal: Nutrition education Formal process to instruct or train a patient/client in a skill or to impart knowledge to help patients/clients voluntarily manage or modify food choices and eating behavior to maintain or improve health. Outcome: Completed/Met Date Met: 03/31/17  RD consulted for nutrition education regarding diabetes.   Lab Results  Component Value Date   HGBA1C 14.5 (H) 03/28/2017    RD provided "Carbohydrate Counting for People with Diabetes" handout from the Academy of Nutrition and Dietetics. Discussed different food groups and their effects on blood sugar, emphasizing carbohydrate-containing foods. Provided list of carbohydrates and recommended serving sizes of common foods.  Discussed importance of controlled and consistent carbohydrate intake throughout the day. Provided examples of ways to balance meals/snacks and encouraged intake of high-fiber, whole grain complex carbohydrates. Teach back method used.  Expect good compliance.  Body mass index is 25.22 kg/m. Pt meets criteria for Overweight based on current BMI.  Current diet order is Carbohydrate Modified. Labs and medications reviewed. CBG's [id number].  No further nutrition interventions warranted at this time. If additional nutrition issues arise, please re-consult RD.  Arthur Holms, RD, LDN Pager #: [phone] After-Hours Pager #: [phone]

## 2017-04-01 ENCOUNTER — Inpatient Hospital Stay: Payer: Self-pay

## 2017-04-02 DIAGNOSIS — Z794 Long term (current) use of insulin: Secondary | ICD-10-CM | POA: Diagnosis not present

## 2017-04-02 DIAGNOSIS — E1165 Type 2 diabetes mellitus with hyperglycemia: Secondary | ICD-10-CM | POA: Diagnosis not present

## 2017-04-02 DIAGNOSIS — E1100 Type 2 diabetes mellitus with hyperosmolarity without nonketotic hyperglycemic-hyperosmolar coma (NKHHC): Secondary | ICD-10-CM | POA: Diagnosis not present

## 2017-04-02 DIAGNOSIS — E1310 Other specified diabetes mellitus with ketoacidosis without coma: Secondary | ICD-10-CM | POA: Diagnosis not present

## 2017-04-02 DIAGNOSIS — N210 Calculus in bladder: Secondary | ICD-10-CM | POA: Diagnosis not present

## 2017-04-02 LAB — CULTURE, BLOOD (ROUTINE X 2)
Culture: NO GROWTH
Culture: NO GROWTH
Special Requests: ADEQUATE
Special Requests: ADEQUATE

## 2017-04-03 DIAGNOSIS — Z7901 Long term (current) use of anticoagulants: Secondary | ICD-10-CM | POA: Diagnosis not present

## 2017-04-03 DIAGNOSIS — E1165 Type 2 diabetes mellitus with hyperglycemia: Secondary | ICD-10-CM | POA: Diagnosis not present

## 2017-04-03 DIAGNOSIS — I2699 Other pulmonary embolism without acute cor pulmonale: Secondary | ICD-10-CM | POA: Diagnosis not present

## 2017-04-03 DIAGNOSIS — I10 Essential (primary) hypertension: Secondary | ICD-10-CM | POA: Diagnosis not present

## 2017-04-03 DIAGNOSIS — Z794 Long term (current) use of insulin: Secondary | ICD-10-CM | POA: Diagnosis not present

## 2017-04-08 ENCOUNTER — Inpatient Hospital Stay (HOSPITAL_COMMUNITY)
Admission: EM | Admit: 2017-04-08 | Discharge: 2017-04-10 | DRG: 176 | Disposition: A | Discharge status: Home / Self Care | Payer: PPO | Attending: Internal Medicine | Admitting: Internal Medicine

## 2017-04-08 ENCOUNTER — Emergency Department (HOSPITAL_COMMUNITY): Payer: PPO

## 2017-04-08 ENCOUNTER — Inpatient Hospital Stay (HOSPITAL_COMMUNITY)
Admit: 2017-04-08 | Discharge: 2017-04-08 | Disposition: A | Discharge status: Home / Self Care | Payer: PPO | Attending: Family Medicine | Admitting: Family Medicine

## 2017-04-08 ENCOUNTER — Encounter (HOSPITAL_COMMUNITY): Payer: Self-pay | Admitting: Emergency Medicine

## 2017-04-08 DIAGNOSIS — I2699 Other pulmonary embolism without acute cor pulmonale: Secondary | ICD-10-CM

## 2017-04-08 DIAGNOSIS — N210 Calculus in bladder: Secondary | ICD-10-CM | POA: Diagnosis not present

## 2017-04-08 DIAGNOSIS — Z86718 Personal history of other venous thrombosis and embolism: Secondary | ICD-10-CM

## 2017-04-08 DIAGNOSIS — E870 Hyperosmolality and hypernatremia: Secondary | ICD-10-CM | POA: Diagnosis present

## 2017-04-08 DIAGNOSIS — Z880 Allergy status to penicillin: Secondary | ICD-10-CM | POA: Diagnosis not present

## 2017-04-08 DIAGNOSIS — I959 Hypotension, unspecified: Secondary | ICD-10-CM | POA: Diagnosis not present

## 2017-04-08 DIAGNOSIS — Z888 Allergy status to other drugs, medicaments and biological substances status: Secondary | ICD-10-CM | POA: Diagnosis not present

## 2017-04-08 DIAGNOSIS — J9811 Atelectasis: Secondary | ICD-10-CM | POA: Diagnosis not present

## 2017-04-08 DIAGNOSIS — I2609 Other pulmonary embolism with acute cor pulmonale: Secondary | ICD-10-CM | POA: Diagnosis not present

## 2017-04-08 DIAGNOSIS — Z794 Long term (current) use of insulin: Secondary | ICD-10-CM

## 2017-04-08 DIAGNOSIS — Z8249 Family history of ischemic heart disease and other diseases of the circulatory system: Secondary | ICD-10-CM | POA: Diagnosis not present

## 2017-04-08 DIAGNOSIS — Z79899 Other long term (current) drug therapy: Secondary | ICD-10-CM | POA: Diagnosis not present

## 2017-04-08 DIAGNOSIS — E871 Hypo-osmolality and hyponatremia: Secondary | ICD-10-CM | POA: Diagnosis not present

## 2017-04-08 DIAGNOSIS — R918 Other nonspecific abnormal finding of lung field: Secondary | ICD-10-CM | POA: Diagnosis not present

## 2017-04-08 DIAGNOSIS — R9431 Abnormal electrocardiogram [ECG] [EKG]: Secondary | ICD-10-CM | POA: Diagnosis not present

## 2017-04-08 DIAGNOSIS — I10 Essential (primary) hypertension: Secondary | ICD-10-CM | POA: Diagnosis not present

## 2017-04-08 DIAGNOSIS — E119 Type 2 diabetes mellitus without complications: Secondary | ICD-10-CM | POA: Diagnosis not present

## 2017-04-08 HISTORY — DX: Atelectasis: J98.11

## 2017-04-08 HISTORY — DX: Other pulmonary embolism without acute cor pulmonale: I26.99

## 2017-04-08 HISTORY — DX: Pneumonia, unspecified organism: J18.9

## 2017-04-08 HISTORY — DX: Acute embolism and thrombosis of unspecified deep veins of unspecified lower extremity: I82.409

## 2017-04-08 HISTORY — DX: Type 2 diabetes mellitus with ketoacidosis without coma: E11.10

## 2017-04-08 HISTORY — DX: Type 2 diabetes mellitus without complications: E11.9

## 2017-04-08 LAB — BASIC METABOLIC PANEL
Anion gap: 9 (ref 5–15)
BUN: 10 mg/dL (ref 6–20)
CO2: 23 mmol/L (ref 22–32)
Calcium: 8.8 mg/dL — ABNORMAL LOW (ref 8.9–10.3)
Chloride: 98 mmol/L — ABNORMAL LOW (ref 101–111)
Creatinine, Ser: 0.83 mg/dL (ref 0.61–1.24)
GFR calc Af Amer: >60 mL/min (ref >60)
GFR calc non Af Amer: >60 mL/min (ref >60)
Glucose, Bld: 281 mg/dL — ABNORMAL HIGH (ref 65–99)
Potassium: 3.7 mmol/L (ref 3.5–5.1)
Sodium: 130 mmol/L — ABNORMAL LOW (ref 135–145)

## 2017-04-08 LAB — I-STAT TROPONIN, ED: Troponin i, poc: 0 ng/mL (ref 0.00–0.08)

## 2017-04-08 LAB — HEPARIN LEVEL (UNFRACTIONATED)
Heparin Unfractionated: 0.29 IU/mL — ABNORMAL LOW (ref 0.30–0.70)
Heparin Unfractionated: 0.25 IU/mL — ABNORMAL LOW (ref 0.30–0.70)

## 2017-04-08 LAB — CBC
HCT: 43.1 % (ref 39.0–52.0)
Hemoglobin: 14.9 g/dL (ref 13.0–17.0)
MCH: 27.3 pg (ref 26.0–34.0)
MCHC: 34.6 g/dL (ref 30.0–36.0)
MCV: 78.9 fL (ref 78.0–100.0)
Platelets: 171 10*3/uL (ref 150–400)
RBC: 5.46 MIL/uL (ref 4.22–5.81)
RDW: 13.1 % (ref 11.5–15.5)
WBC: 12.6 10*3/uL — ABNORMAL HIGH (ref 4.0–10.5)

## 2017-04-08 LAB — CBG MONITORING, ED
Glucose-Capillary: 235 mg/dL — ABNORMAL HIGH (ref 65–99)
Glucose-Capillary: 227 mg/dL — ABNORMAL HIGH (ref 65–99)

## 2017-04-08 LAB — D-DIMER, QUANTITATIVE: D-Dimer, Quant: 1.74 ug/mL-FEU — ABNORMAL HIGH (ref 0.00–0.50)

## 2017-04-08 LAB — PROTIME-INR
INR: 1.11
INR: 1.16
Prothrombin Time: 14.4 seconds (ref 11.4–15.2)
Prothrombin Time: 14.8 seconds (ref 11.4–15.2)

## 2017-04-08 LAB — GLUCOSE, CAPILLARY
Glucose-Capillary: 213 mg/dL — ABNORMAL HIGH (ref 65–99)
Glucose-Capillary: 223 mg/dL — ABNORMAL HIGH (ref 65–99)

## 2017-04-08 LAB — APTT
aPTT: 34 seconds (ref 24–36)
aPTT: 72 seconds — ABNORMAL HIGH (ref 24–36)

## 2017-04-08 MED ORDER — ACETAMINOPHEN 650 MG RE SUPP: 650.0000 mg | Freq: Four times a day (QID) | RECTAL | Status: DC | PRN

## 2017-04-08 MED ORDER — HEPARIN BOLUS VIA INFUSION
5000.0000 [IU] | Freq: Once | INTRAVENOUS | Status: AC
  Administered 2017-04-08: 5000 [IU] via INTRAVENOUS
  Filled 2017-04-08: qty 5000

## 2017-04-08 MED ORDER — LOSARTAN POTASSIUM-HCTZ 100-12.5 MG PO TABS: 1.0000 | ORAL_TABLET | Freq: Every day | ORAL | Status: DC

## 2017-04-08 MED ORDER — INSULIN ASPART 100 UNIT/ML ~~LOC~~ SOLN
0.0000 [IU] | Freq: Every day | SUBCUTANEOUS | Status: DC
  Administered 2017-04-08: 2 [IU] via SUBCUTANEOUS
  Administered 2017-04-09: 3 [IU] via SUBCUTANEOUS

## 2017-04-08 MED ORDER — IOPAMIDOL (ISOVUE-370) INJECTION 76%
INTRAVENOUS | Status: AC
  Administered 2017-04-08: 100 mL
  Filled 2017-04-08: qty 100

## 2017-04-08 MED ORDER — HYDROCHLOROTHIAZIDE 12.5 MG PO CAPS
12.5000 mg | ORAL_CAPSULE | Freq: Every day | ORAL | Status: DC
  Administered 2017-04-08 – 2017-04-09 (×2): 12.5 mg via ORAL
  Filled 2017-04-08 (×2): qty 1

## 2017-04-08 MED ORDER — OXYCODONE HCL 5 MG PO TABS: 5.0000 mg | ORAL_TABLET | ORAL | Status: DC | PRN

## 2017-04-08 MED ORDER — ONDANSETRON HCL 4 MG PO TABS: 4.0000 mg | ORAL_TABLET | Freq: Four times a day (QID) | ORAL | Status: DC | PRN

## 2017-04-08 MED ORDER — HEPARIN (PORCINE) IN NACL 100-0.45 UNIT/ML-% IJ SOLN
1250.0000 [IU]/h | INTRAMUSCULAR | Status: DC
  Administered 2017-04-08: 1250 [IU]/h via INTRAVENOUS
  Filled 2017-04-08: qty 250

## 2017-04-08 MED ORDER — INSULIN ASPART 100 UNIT/ML ~~LOC~~ SOLN: 4.0000 [IU] | Freq: Three times a day (TID) | SUBCUTANEOUS | Status: DC

## 2017-04-08 MED ORDER — LOSARTAN POTASSIUM 50 MG PO TABS
100.0000 mg | ORAL_TABLET | Freq: Every day | ORAL | Status: DC
  Administered 2017-04-08 – 2017-04-09 (×2): 100 mg via ORAL
  Filled 2017-04-08 (×2): qty 2

## 2017-04-08 MED ORDER — HEPARIN BOLUS VIA INFUSION
1000.0000 [IU] | Freq: Once | INTRAVENOUS | Status: AC
  Administered 2017-04-08: 1000 [IU] via INTRAVENOUS
  Filled 2017-04-08: qty 1000

## 2017-04-08 MED ORDER — ENSURE ENLIVE PO LIQD
237.0000 mL | Freq: Two times a day (BID) | ORAL | Status: DC
  Administered 2017-04-08: 237 mL via ORAL

## 2017-04-08 MED ORDER — ACETAMINOPHEN 325 MG PO TABS: 650.0000 mg | ORAL_TABLET | Freq: Four times a day (QID) | ORAL | Status: DC | PRN

## 2017-04-08 MED ORDER — INSULIN GLARGINE 100 UNIT/ML ~~LOC~~ SOLN
15.0000 [IU] | Freq: Two times a day (BID) | SUBCUTANEOUS | Status: DC
  Administered 2017-04-08 – 2017-04-10 (×5): 15 [IU] via SUBCUTANEOUS
  Filled 2017-04-08 (×6): qty 0.15

## 2017-04-08 MED ORDER — HEPARIN (PORCINE) IN NACL 100-0.45 UNIT/ML-% IJ SOLN
1700.0000 [IU]/h | INTRAMUSCULAR | Status: DC
  Administered 2017-04-08: 1400 [IU]/h via INTRAVENOUS
  Filled 2017-04-08 (×2): qty 250

## 2017-04-08 MED ORDER — INSULIN ASPART 100 UNIT/ML ~~LOC~~ SOLN
0.0000 [IU] | Freq: Three times a day (TID) | SUBCUTANEOUS | Status: DC
  Administered 2017-04-08 (×3): 5 [IU] via SUBCUTANEOUS
  Administered 2017-04-09 (×2): 3 [IU] via SUBCUTANEOUS
  Administered 2017-04-09 – 2017-04-10 (×2): 5 [IU] via SUBCUTANEOUS
  Filled 2017-04-08 (×2): qty 1

## 2017-04-08 MED ORDER — POLYETHYLENE GLYCOL 3350 17 G PO PACK: 17.0000 g | PACK | Freq: Every day | ORAL | Status: DC | PRN

## 2017-04-08 MED ORDER — ONDANSETRON HCL 4 MG/2ML IJ SOLN: 4.0000 mg | Freq: Four times a day (QID) | INTRAMUSCULAR | Status: DC | PRN

## 2017-04-08 NOTE — Progress Notes (Signed)
PROGRESS NOTE    Todd Avila  ZOX:[id number] DOB: Apr 14, 1951 DOA: 04/08/2017 PCP: [name], MD    Brief Narrative:  66 year old male who presented with chest pain. Patient is known to have hypertension and insulin-dependent diabetes mellitus. Patient was recently hospitalized for hyperosmolar state, he was hospitalized for 3 days, discharged on insulin therapy. 48 hours prior to this hospitalization he had left-sided chest pain, worse when supine, moderate to severe intensity, associated with a dry cough but no hemoptysis. On initial physical examination temperature 99.2, heart rate 99, respiratory 25, blood pressure 103/70, oxygen 95% room air. Moist mucous membranes, lungs were clear to auscultation bilaterally, no wheezing, rales or rhonchi, heart S1-S2 present rhythmic, no gallops or murmurs. Abdomen was soft nontender, low sugars no edema. Sodium 130, potassium 3.7, chloride 98, bicarbonate 23, glucose 281, BUN 10, creatinine 0.83, white count 12.6, hemoglobin 14.9, hematocrit 43.1, platelets 171. D-dimer 1.74. Chest x-ray with left lower lobe atelectasis. EKG with normal sinus rhythm, normal axis and normal intervals. CT chest with the pulmonary embolism within the pulmonary artery to the left lower lobe and left lingula. Pulmonary infarct at the left lower lobe.   The patient was admitted to the hospital with working diagnosis of acute pulmonary embolism.  Assessment & Plan:   Principal Problem:   Pulmonary embolism (HCC) Active Problems:   Type 2 diabetes mellitus without complication, with long-term current use of insulin (HCC)   HTN (hypertension)   Hyponatremia   1. Acute pulmonary embolism. Will continue anticoagulation with heparin, will follow on echocardiogram and dopplers lower extremities for risk stratification. Patient had a recent hospitalization, cw provoked VTE. Will continue oxymetry monitoring and as needed supplemental 02 per [place].   2. Insulin dependent DM.  Will continue glucose cover and monitoring with insulin sliding scale, patient tolerating po well. Serum glucose 281. Basal insulin regimen with 15 units of glargine.   3. HTN. Will continue blood pressure control. HCTZ and losartan.   4. Hyponatremia. Will follow on renal panel in am, patient tolerating po well.    DVT prophylaxis: IV heparin  Code Status: Full  Family Communication:  Disposition Plan:    Consultants:     Procedures:   Antimicrobials:    Subjective: Patient with no chest pain, no dyspnea, no nausea or vomiting.   Objective: Vitals:   04/08/17 1300 04/08/17 1330 04/08/17 1400 04/08/17 1420  BP: 98/77 102/78 107/74 94/73  Pulse: 87 87  92  Resp: (!) 24 (!) 29 (!) 29 (!) 22  Temp:    98.7 F (37.1 C)  TempSrc:    Oral  SpO2: 96% 93%    Weight:    76.5 kg (168 lb 11.2 oz)  Height:    5\' 7"  (1.702 m)    Intake/Output Summary (Last 24 hours) at 04/08/17 1458 Last data filed at 04/08/17 1354  Gross per 24 hour  Intake                0 ml  Output              400 ml  Net             -400 ml   Filed Weights   04/08/17 0500 04/08/17 1420  Weight: 73 kg (160 lb 15 oz) 76.5 kg (168 lb 11.2 oz)    Examination:  General exam: not in pain or dyspnea E ENT: no pallor or icterus.  Respiratory system: No wheezing, rales or rhonchi.  Respiratory effort normal.  Cardiovascular system: S1 & S2 heard, RRR. No JVD, murmurs, rubs, gallops or clicks. No pedal edema. Gastrointestinal system: Abdomen is nondistended, soft and nontender. No organomegaly or masses felt. Normal bowel sounds heard. Central nervous system: Alert and oriented. No focal neurological deficits. Extremities: Symmetric 5 x 5 power. Skin: No rashes, lesions or ulcers     Data Reviewed: I have personally reviewed following labs and imaging studies  CBC:  Recent Labs Lab 04/08/17 0025  WBC 12.6*  HGB 14.9  HCT 43.1  MCV 78.9  PLT 171   Basic Metabolic Panel:  Recent Labs Lab  04/08/17 0025  NA 130*  K 3.7  CL 98*  CO2 23  GLUCOSE 281*  BUN 10  CREATININE 0.83  CALCIUM 8.8*   GFR: Estimated Creatinine Clearance: 81.9 mL/min (by C-G formula based on SCr of 0.83 mg/dL). Liver Function Tests: No results for input(s): AST, ALT, ALKPHOS, BILITOT, PROT, ALBUMIN in the last 168 hours. No results for input(s): LIPASE, AMYLASE in the last 168 hours. No results for input(s): AMMONIA in the last 168 hours. Coagulation Profile:  Recent Labs Lab 04/08/17 0358 04/08/17 1224  INR 1.11 1.16   Cardiac Enzymes: No results for input(s): CKTOTAL, CKMB, CKMBINDEX, TROPONINI in the last 168 hours. BNP (last 3 results) No results for input(s): PROBNP in the last 8760 hours. HbA1C: No results for input(s): HGBA1C in the last 72 hours. CBG:  Recent Labs Lab 04/08/17 0810 04/08/17 1337  GLUCAP 235* 227*   Lipid Profile: No results for input(s): CHOL, HDL, LDLCALC, TRIG, CHOLHDL, LDLDIRECT in the last 72 hours. Thyroid Function Tests: No results for input(s): TSH, T4TOTAL, FREET4, T3FREE, THYROIDAB in the last 72 hours. Anemia Panel: No results for input(s): VITAMINB12, FOLATE, FERRITIN, TIBC, IRON, RETICCTPCT in the last 72 hours. Sepsis Labs: No results for input(s): PROCALCITON, LATICACIDVEN in the last 168 hours.  No results found for this or any previous visit (from the past 240 hour(s)).       Radiology Studies: Dg Chest 2 View  Result Date: 04/08/2017 CLINICAL DATA:  Acute onset of right lower chest spasms. Initial encounter. EXAM: CHEST  2 VIEW COMPARISON:  Chest radiograph performed 03/28/2017 FINDINGS: Mild bibasilar airspace opacities may reflect atelectasis or possibly mild pneumonia. No pleural effusion or pneumothorax is seen. The cardiomediastinal silhouette is normal in size. No acute osseous abnormalities are identified. IMPRESSION: No displaced rib fracture seen. Mild bibasilar airspace opacities may reflect atelectasis or possibly mild  pneumonia, depending on the patient's symptoms. Electronically Signed   By: Roanna RaiderJeffery  Chang M.D.   On: 04/08/2017 00:54   Ct Angio Chest Pe W And/or Wo Contrast  Result Date: 04/08/2017 CLINICAL DATA:  Acute onset of left lateral muscle spasms and orthopnea. Initial encounter. EXAM: CT ANGIOGRAPHY CHEST WITH CONTRAST TECHNIQUE: Multidetector CT imaging of the chest was performed using the standard protocol during bolus administration of intravenous contrast. Multiplanar CT image reconstructions and MIPs were obtained to evaluate the vascular anatomy. CONTRAST:  51 mL of Isovue 370 IV contrast COMPARISON:  Chest radiograph performed earlier today at 12:36 a.m. FINDINGS: Cardiovascular: There is pulmonary embolus within the pulmonary artery to the left lower lobe and left lingula. The RV/LV ratio is 1.3, corresponding to right heart strain and at least submassive pulmonary embolus. The heart remains normal in size. The thoracic aorta is unremarkable in appearance. No calcific atherosclerotic disease is seen. The great vessels are unremarkable in appearance. Mediastinum/Nodes: The mediastinum is unremarkable in appearance. No mediastinal lymphadenopathy is  seen. No pericardial effusion is identified. The visualized portions of the thyroid gland are unremarkable. No axillary lymphadenopathy is seen. Lungs/Pleura: Partial consolidation of the left lower lobe is concerning for pulmonary infarct. Mild right basilar atelectasis is noted. No significant pleural effusion or pneumothorax is seen. No dominant mass is identified. Upper Abdomen: The visualized portions of the liver and spleen are unremarkable. Musculoskeletal: No acute osseous abnormalities are identified. Anterior bridging osteophytes are noted along the thoracic spine. The visualized musculature is unremarkable in appearance. Review of the MIP images confirms the above findings. IMPRESSION: 1. Pulmonary embolus within the pulmonary artery to the left lower  lobe and left lingula. CT evidence of right heart strain (RV/LV Ratio = 1.3) consistent with at least submassive (intermediate risk) PE. The presence of right heart strain has been associated with an increased risk of morbidity and mortality. Please activate Code PE by paging [phone]. 2. Pulmonary infarct noted at the left lower lung lobe. Mild right basilar atelectasis noted. Critical Value/emergent results were called by telephone at the time of interpretation on 04/08/2017 at 5:03 am to Dr. Zadie Rhine, who verbally acknowledged these results. Electronically Signed   By: Roanna Raider M.D.   On: 04/08/2017 05:05        Scheduled Meds: . hydrochlorothiazide  12.5 mg Oral Daily  . insulin aspart  0-15 Units Subcutaneous TID WC  . insulin aspart  0-5 Units Subcutaneous QHS  . insulin glargine  15 Units Subcutaneous BID AC & HS  . losartan  100 mg Oral Daily   Continuous Infusions: . heparin 1,250 Units/hr (04/08/17 0536)     LOS: 0 days       Todd Avila Annett Gula, MD [facility] Pager [phone]  If 7PM-7AM, please contact night-coverage [email or web address] Password [facility] 04/08/2017, 2:58 PM

## 2017-04-08 NOTE — ED Notes (Signed)
Doctor called back - orders to be adjusted

## 2017-04-08 NOTE — Progress Notes (Signed)
ANTICOAGULATION CONSULT NOTE -follow up Pharmacy Consult for heparin Indication: pulmonary embolus  Allergies  Allergen Reactions  . Amlodipine Other (See Comments)    "Feet Swelling"  . Penicillins Other (See Comments)    unknown    Patient Measurements: Height: 5\' 7"  (170.2 cm) Weight: 168 lb 11.2 oz (76.5 kg) IBW/kg (Calculated) : 66.1  heparin dosing wt = TBW= 76.5 kg  Vital Signs: Temp: 98.7 F (37.1 C) (08/15 1420) Temp Source: Oral (08/15 1420) BP: 94/73 (08/15 1420) Pulse Rate: 92 (08/15 1420)  Labs:  Recent Labs  04/08/17 0025 04/08/17 0358 04/08/17 1224 04/08/17 1234  HGB 14.9  --   --   --   HCT 43.1  --   --   --   PLT 171  --   --   --   APTT  --  34 72*  --   LABPROT  --  14.4 14.8  --   INR  --  1.11 1.16  --   HEPARINUNFRC  --   --   --  0.29*  CREATININE 0.83  --   --   --     Estimated Creatinine Clearance: 81.9 mL/min (by C-G formula based on SCr of 0.83 mg/dL).   Medical History: Past Medical History:  Diagnosis Date  . Acute pulmonary embolism (HCC) 04/08/2017  . Bladder stones   . Diabetes mellitus without complication (HCC)   . DVT (deep venous thrombosis) (HCC)    Unsure if this is superficial thrombophlebitis, [place]  . HTN (hypertension)     Assessment: 66yo male c/o left lateral "chest muscle spasms" w/ orthopnea, hx of extensive travel over last month, CT shows PE w/ evidence of RHS, started on IV  Heparin.  Initial 6 hr heparin level is 0.29, slightly below therapeutic goal 0.3-0.7 units/ml , on IV heparin rate 1250 units/hr for acute submassive PE.  No bleeding reported.    Goal of Therapy:  Heparin level 0.3-0.7 units/ml Monitor platelets by anticoagulation protocol: Yes   Plan:  Give heparin 1000 unit IV bolus.  Increase heparin drip to 1400 units/hr and monitor heparin levels and CBC. Heparin level in 6 hours Daily HL, cbc while on IV heparin. F/u on plan for oral anticoagulation   Noah Delaineuth Kebrina Friend,  RPh Clinical Pharmacist Pager: [phone]-[phone] [phone] [phone] Main Pharmacy [phone] 04/08/2017,3:11 PM

## 2017-04-08 NOTE — Progress Notes (Signed)
ANTICOAGULATION CONSULT NOTE - Initial Consult  Pharmacy Consult for heparin Indication: pulmonary embolus  Allergies  Allergen Reactions  . Amlodipine Other (See Comments)    "Feet Swelling"  . Penicillins Other (See Comments)    unknown    Patient Measurements: Height: 5\' 7"  (170.2 cm) Weight: 160 lb 15 oz (73 kg) IBW/kg (Calculated) : 66.1  Vital Signs: Temp: 99.2 F (37.3 C) (08/15 0026) Temp Source: Oral (08/15 0026) BP: 113/80 (08/15 0430) Pulse Rate: 84 (08/15 0430)  Labs:  Recent Labs  04/08/17 0025  HGB 14.9  HCT 43.1  PLT 171  CREATININE 0.83    Estimated Creatinine Clearance: 81.9 mL/min (by C-G formula based on SCr of 0.83 mg/dL).   Medical History: Past Medical History:  Diagnosis Date  . Bladder stones   . Diabetes mellitus without complication (HCC)   . HTN (hypertension)     Assessment: 66yo male c/o left lateral "chest muscle spasms" w/ orthopnea, hx of extensive travel over last month, CT shows PE w/ evidence of RHS, to begin heparin.  Goal of Therapy:  Heparin level 0.3-0.7 units/ml Monitor platelets by anticoagulation protocol: Yes   Plan:  Will give heparin 5000 units IV bolus x1 followed by gtt at 1250 units/hr and monitor heparin levels and CBC.  Vernard GamblesVeronda Terence Bart, PharmD, BCPS  04/08/2017,5:11 AM

## 2017-04-08 NOTE — ED Notes (Signed)
Meal tray ordered 

## 2017-04-08 NOTE — ED Notes (Signed)
PT states he finished cipro today for a kidney infection.

## 2017-04-08 NOTE — H&P (Signed)
History and Physical  Patient Name: Todd Avila     ZOX:[id number]    DOB: 02/22/1951    DOA: 04/08/2017 PCP: Mila PalmerWolters, Sharon, MD  Patient coming from: Home  Chief Complaint: Chest pain      HPI: Todd Avila is a 66 y.o. male with a past medical history significant for HTN and new onset IDDM who presents with chest pain.  The patient was recently admitted to the hospital for new onset diabetes with HHS/DKA, in the hospital for 3 days, discharged on insulin, has been well since then with sugars in the 200s and 300s.  Then 2 days ago, he woke with some left-sided chest discomfort. He follows a "muscle spasm", from sleeping wrong and is cold room. While he was upright and around during the day he had no pain, but as soon as he laid down and the pain was severe.  Tonight the pain is again so severe that he couldn't rest and so he came to the emergency room. There is mild dry cough, there is pain with deep breathing. There is no leg swelling, hemoptysis, fever, sputum production.  ED course: -Temp 99.82F, heart rate 99, respirations 25, blood pressure 103/70, pulse ox 95% on room air -Na 130, K 3.7, Cr 0.8 (baseline 1.0), WBC 12.6K, Hgb 14.9 -Troponin negative -D-dimer 1.7 -Chest x-ray showed bibasilar opacities -CT angiogram of the chest showed a left pulmonary embolus some from the left main pulmonary artery, possible pulmonary infarcts on the left, RV/LV 1.3 -Heparin was started and TRH were asked to evaluate for new program was    He was recently hospitalized. Before that, he drove to [place] in July.  He thinks he had a "blood clot" about 10 years ago, but he is not sure if this was superficial thrombophlebitis or DVT.  All he remembers is that an ultrasound was done, he was given about 2 weeks of Lovenox, and had no blood thinner after that.  Never a PE.  No family history of PE.     ROS: Review of Systems  Cardiovascular: Positive for chest pain.  All other systems reviewed  and are negative.         Past Medical History:  Diagnosis Date  . Bladder stones   . Diabetes mellitus without complication (HCC)   . DVT (deep venous thrombosis) (HCC)    Unsure if this is superficial thrombophlebitis, happened in ~2010 in [place]  . HTN (hypertension)     Past Surgical History:  Procedure Laterality Date  . BLADDER STONE REMOVAL      Social History: Patient lives with his girlfriend.  The patient walks unassisted.  Never smoker.  Minimal alcohol.  From [place], worked in [employer].  [place] his girlfriend.  Allergies  Allergen Reactions  . Amlodipine Other (See Comments)    "Feet Swelling"  . Penicillins Other (See Comments)    unknown    Family history: family history includes Heart failure in his father.  Prior to Admission medications   Medication Sig Start Date End Date Taking? Authorizing Provider  insulin aspart (NOVOLOG) 100 UNIT/ML injection Inject 5 Units into the skin 3 (three) times daily with meals. Patient taking differently: Inject 10 Units into the skin 3 (three) times daily with meals.  03/31/17  Yes Efrain SellaSabia, Eiad, MD  Insulin Glargine (LANTUS SOLOSTAR) 100 UNIT/ML Solostar Pen Inject 15 Units into the skin 2 (two) times daily at 8 am and 10 pm. 03/31/17  Yes Efrain SellaSabia, Eiad, MD  losartan-hydrochlorothiazide (HYZAAR) 100-12.5 MG tablet Take 1 tablet by mouth daily. 02/28/17  Yes [provider]       Physical Exam: BP 113/80   Pulse 84   Temp 99.2 F (37.3 C) (Oral)   Resp (!) 26   Ht 5\' 7"  (1.702 m)   Wt 73 kg (160 lb 15 oz)   SpO2 95%   BMI 25.21 kg/m  General appearance: Well-developed, adult male, alert and in no acute distress, pain with coughing.   Eyes: Anicteric, conjunctiva pink, lids and lashes normal. ENT: No nasal deformity, discharge, epistaxis.  Hearing normal. OP moist without lesions.   Skin: Warm and dry.  No jaundice.  No suspicious rashes or lesions. Cardiac: Tachycardic regular, nl S1-S2, no  murmurs appreciated.  Capillary refill is brisk.  JVP normal.  No LE edema.  Radial and DP pulses 2+ and symmetric. Respiratory: Normal respiratory rate and rhythm.  CTAB without rales or wheezes.  Diminished at left base. Abdomen: Abdomen soft.  No TTP. No ascites, distension, hepatosplenomegaly.   MSK: No deformities or effusions.  No cyanosis or clubbing. Neuro: Cranial nerves normal.  Sensation intact to light touch. Speech is fluent.  Muscle strength normal.    Psych: Sensorium intact and responding to questions, attention normal.  Behavior appropriate.  Affect normal.  Judgment and insight appear normal.     Labs on Admission:  I have personally reviewed following labs and imaging studies: CBC:  Recent Labs Lab 04/08/17 0025  WBC 12.6*  HGB 14.9  HCT 43.1  MCV 78.9  PLT 171   Basic Metabolic Panel:  Recent Labs Lab 04/08/17 0025  NA 130*  K 3.7  CL 98*  CO2 23  GLUCOSE 281*  BUN 10  CREATININE 0.83  CALCIUM 8.8*   GFR: Estimated Creatinine Clearance: 81.9 mL/min (by C-G formula based on SCr of 0.83 mg/dL).  Coagulation Profile:  Recent Labs Lab 04/08/17 0358  INR 1.11        Radiological Exams on Admission: Personally reviewed CXR shows no obvious opacity; CTA report reviewed: Dg Chest 2 View  Result Date: 04/08/2017 CLINICAL DATA:  Acute onset of right lower chest spasms. Initial encounter. EXAM: CHEST  2 VIEW COMPARISON:  Chest radiograph performed 03/28/2017 FINDINGS: Mild bibasilar airspace opacities may reflect atelectasis or possibly mild pneumonia. No pleural effusion or pneumothorax is seen. The cardiomediastinal silhouette is normal in size. No acute osseous abnormalities are identified. IMPRESSION: No displaced rib fracture seen. Mild bibasilar airspace opacities may reflect atelectasis or possibly mild pneumonia, depending on the patient's symptoms. Electronically Signed   By: Roanna Raider M.D.   On: 04/08/2017 00:54   Ct Angio Chest Pe W  And/or Wo Contrast  Result Date: 04/08/2017 CLINICAL DATA:  Acute onset of left lateral muscle spasms and orthopnea. Initial encounter. EXAM: CT ANGIOGRAPHY CHEST WITH CONTRAST TECHNIQUE: Multidetector CT imaging of the chest was performed using the standard protocol during bolus administration of intravenous contrast. Multiplanar CT image reconstructions and MIPs were obtained to evaluate the vascular anatomy. CONTRAST:  51 mL of Isovue 370 IV contrast COMPARISON:  Chest radiograph performed earlier today at 12:36 a.m. FINDINGS: Cardiovascular: There is pulmonary embolus within the pulmonary artery to the left lower lobe and left lingula. The RV/LV ratio is 1.3, corresponding to right heart strain and at least submassive pulmonary embolus. The heart remains normal in size. The thoracic aorta is unremarkable in appearance. No calcific atherosclerotic disease is seen. The great vessels are unremarkable in appearance.  Mediastinum/Nodes: The mediastinum is unremarkable in appearance. No mediastinal lymphadenopathy is seen. No pericardial effusion is identified. The visualized portions of the thyroid gland are unremarkable. No axillary lymphadenopathy is seen. Lungs/Pleura: Partial consolidation of the left lower lobe is concerning for pulmonary infarct. Mild right basilar atelectasis is noted. No significant pleural effusion or pneumothorax is seen. No dominant mass is identified. Upper Abdomen: The visualized portions of the liver and spleen are unremarkable. Musculoskeletal: No acute osseous abnormalities are identified. Anterior bridging osteophytes are noted along the thoracic spine. The visualized musculature is unremarkable in appearance. Review of the MIP images confirms the above findings. IMPRESSION: 1. Pulmonary embolus within the pulmonary artery to the left lower lobe and left lingula. CT evidence of right heart strain (RV/LV Ratio = 1.3) consistent with at least submassive (intermediate risk) PE. The  presence of right heart strain has been associated with an increased risk of morbidity and mortality. Please activate Code PE by paging [phone]. 2. Pulmonary infarct noted at the left lower lung lobe. Mild right basilar atelectasis noted. Critical Value/emergent results were called by telephone at the time of interpretation on 04/08/2017 at 5:03 am to Dr. Zadie Rhine, who verbally acknowledged these results. Electronically Signed   By: Roanna Raider M.D.   On: 04/08/2017 05:05    EKG: Independently reviewed. Rate 103, QTc 442, sinus tachycardia.        Assessment/Plan Principal Problem:   Pulmonary embolism (HCC) Active Problems:   Type 2 diabetes mellitus without complication, with long-term current use of insulin (HCC)   HTN (hypertension)   Hyponatremia  1. Acute pulmonary embolism:  Submassive by radiographic criteria, PESI only class II, 76.  HASBLED 1 for age.  He states "I am not a medicine person", and I suspect he is minimizing, and he had a DVT but did not have proper follow up 10 yearrs ago, although this is unclear.    He will try to obtain records for Dr. Laurine Blazer. -Continue heparin -Consult to CM regarding coverage of NOACs -Obtain echocardiogram and dopplers -Monitor on tele   2. Insulin dependent diabetes:  Poorly controlled at admission.  States sugars at home in the "200s and 300s".  Question compliance.   -Continue glargine -Mealtime insulin ordered, plus corrections  3. Hypertension:  -Continue HCTZ and losartan  4. Hyponatremia:  Mild.  Was pretty severely hypernatremic in his last hospitalization. -Trend Na           DVT prophylaxis: N/A  Code Status: FULL  Family Communication: None present  Disposition Plan: Anticipate IV heparin until can change to oral anticoagulant and discharge. Consults called: None Admission status: INPATIENT        Medical decision making: Patient seen at 5:45 AM on 04/08/2017.  The patient was  discussed with Dr. Bebe Shaggy.  What exists of the patient's chart was reviewed in depth and summarized above.  Clinical condition: hemodynamically stable while in ER, mentation good.        Alberteen Sam [facility] Pager [phone]      At the time of admission, it appears that the appropriate admission status for this patient is INPATIENT. This is judged to be reasonable and necessary in order to provide the required intensity of service to ensure the patient's safety given the presenting symptoms, physical exam findings, and initial radiographic and laboratory data in the context of their chronic comorbidities.  Together, these circumstances are felt to place him at high risk for further clinical deterioration threatening life, limb,  or organ.   Patient requires inpatient status due to high intensity of service, high risk for further deterioration and high frequency of surveillance required because of this acute illness that poses a threat to life, limb or bodily function.  Factors that support inpatient status include tachycardia, tachypnea, and new large pulmonary embolism with radiographic signs of heart strain.  I certify that at the point of admission it is my clinical judgment that the patient will require inpatient hospital care spanning beyond 2 midnights from the point of admission and that early discharge would result in unnecessary risk of decompensation and readmission or threat to life, limb or bodily function.

## 2017-04-08 NOTE — ED Notes (Signed)
CBG 227 

## 2017-04-08 NOTE — ED Triage Notes (Signed)
Patient  reports left lateral chest " muscle spasms " with orthopnea onset this evening , denies emesis or diaphoresis .

## 2017-04-08 NOTE — ED Notes (Signed)
Pt states that he doesn't have any chest pain at this time but travel recently in the last month all over the [place]

## 2017-04-08 NOTE — Progress Notes (Signed)
Preliminary results by tech - Venous Duplex Lower Ext. Completed. Negative for deep and superficial vein thrombosis in both legs.  Marshun Duva, BS, RDMS, RVT  

## 2017-04-08 NOTE — ED Provider Notes (Signed)
[facility]-EMERGENCY DEPT Provider Note   CSN: [id number] Arrival date & time: 04/08/17  0001     History   Chief Complaint Chief Complaint  Patient presents with  . Chest Pain    HPI Todd Avila is a 66 y.o. male.  The history is provided by the patient and the spouse.  Chest Pain   This is a new problem. The current episode started yesterday. The problem occurs constantly. The problem has not changed since onset.The pain is associated with breathing. The pain is present in the lateral region. The pain is moderate. Quality: "SPASMS" The pain does not radiate. The symptoms are aggravated by deep breathing. Associated symptoms include shortness of breath. Pertinent negatives include no abdominal pain, no fever, no hemoptysis and no syncope. Associated symptoms comments: MINIMAL COUGH . He has tried nothing for the symptoms. Risk factors include being elderly.  Pertinent negatives for past medical history include no CAD and no PE.  pt reports for past 24 hrs he has had left sided muscle spasm It seems worse with lying flat and also breathing He reports SOB He has had minimal cough No fever He was recently in hospital for new onset diabetes and has been sedentary for past week He also reports recent bladder stone and finished up cipro prescription   He reports recent [place]   Past Medical History:  Diagnosis Date  . Bladder stones   . Diabetes mellitus without complication (HCC)   . HTN (hypertension)     Patient Active Problem List   Diagnosis Date Noted  . Other specified diabetes mellitus with hyperosmolarity without nonketotic hyperglycemic-hyperosmolar coma [facility] (HCC) 03/28/2017  . New onset type 2 diabetes mellitus (HCC) 03/28/2017  . Acute kidney injury (HCC) 03/28/2017  . Abnormal urinalysis 03/28/2017  . Bladder stones 03/28/2017  . HTN (hypertension) 03/28/2017    Past Surgical History:  Procedure Laterality  Date  . BLADDER STONE REMOVAL         Home Medications    Prior to Admission medications   Medication Sig Start Date End Date Taking? Authorizing Provider  insulin aspart (NOVOLOG) 100 UNIT/ML injection Inject 5 Units into the skin 3 (three) times daily with meals. Patient taking differently: Inject 10 Units into the skin 3 (three) times daily with meals.  03/31/17  Yes Efrain SellaSabia, Eiad, MD  Insulin Glargine (LANTUS SOLOSTAR) 100 UNIT/ML Solostar Pen Inject 15 Units into the skin 2 (two) times daily at 8 am and 10 pm. 03/31/17  Yes Efrain SellaSabia, Eiad, MD  losartan-hydrochlorothiazide (HYZAAR) 100-12.5 MG tablet Take 1 tablet by mouth daily. 02/28/17  Yes [provider]  ciprofloxacin (CIPRO) 500 MG tablet Take 1 tablet (500 mg total) by mouth 2 (two) times daily. Patient not taking: Reported on 04/08/2017 03/31/17 04/10/17  Efrain SellaSabia, Eiad, MD    Family History No family history on file.  Social History Social History  Substance Use Topics  . Smoking status: Never Smoker  . Smokeless tobacco: Never Used  . Alcohol use No     Allergies   Amlodipine and Penicillins   Review of Systems Review of Systems  Constitutional: Negative for fever.  Respiratory: Positive for shortness of breath. Negative for hemoptysis.   Cardiovascular: Positive for chest pain. Negative for syncope.  Gastrointestinal: Negative for abdominal pain and blood in stool.  All other systems reviewed and are negative.    Physical Exam Updated Vital Signs BP 104/72   Pulse 85  Temp 99.2 F (37.3 C) (Oral)   Resp (!) 26   SpO2 94%   Physical Exam CONSTITUTIONAL: Well developed/well nourished HEAD: Normocephalic/atraumatic EYES: EOMI  ENMT: Mucous membranes moist NECK: supple no meningeal signs SPINE/BACK:entire spine nontender CV: S1/S2 noted, no murmurs/rubs/gallops noted LUNGS: bibasilar crackles noted.   Chest - no tenderness to palpation ABDOMEN: soft, nontender, no rebound or guarding, bowel sounds  noted throughout abdomen GU:no cva tenderness NEURO: Pt is awake/alert/appropriate, moves all extremitiesx4.  No facial droop.   EXTREMITIES: pulses normal/equal, full ROM, no calf tenderness SKIN: warm, color normal PSYCH: no abnormalities of mood noted, alert and oriented to situation   ED Treatments / Results  Labs (all labs ordered are listed, but only abnormal results are displayed) Labs Reviewed  BASIC METABOLIC PANEL - Abnormal; Notable for the following:       Result Value   Sodium 130 (*)    Chloride 98 (*)    Glucose, Bld 281 (*)    Calcium 8.8 (*)    All other components within normal limits  CBC - Abnormal; Notable for the following:    WBC 12.6 (*)    All other components within normal limits  D-DIMER, QUANTITATIVE (NOT AT [facility]) - Abnormal; Notable for the following:    D-Dimer, Quant 1.74 (*)    All other components within normal limits  APTT  PROTIME-INR  HEPARIN LEVEL (UNFRACTIONATED)  APTT  PROTIME-INR  I-STAT TROPONIN, ED    EKG  EKG Interpretation  Date/Time:  Wednesday April 08 2017 00:06:48 EDT Ventricular Rate:  103 PR Interval:  138 QRS Duration: 92 QT Interval:  338 QTC Calculation: 442 R Axis:   65 Text Interpretation:  Sinus tachycardia Otherwise normal ECG No significant change since last tracing Confirmed by Zadie Rhine ([id number]) on 04/08/2017 2:56:23 AM       Radiology Dg Chest 2 View  Result Date: 04/08/2017 CLINICAL DATA:  Acute onset of right lower chest spasms. Initial encounter. EXAM: CHEST  2 VIEW COMPARISON:  Chest radiograph performed 03/28/2017 FINDINGS: Mild bibasilar airspace opacities may reflect atelectasis or possibly mild pneumonia. No pleural effusion or pneumothorax is seen. The cardiomediastinal silhouette is normal in size. No acute osseous abnormalities are identified. IMPRESSION: No displaced rib fracture seen. Mild bibasilar airspace opacities may reflect atelectasis or possibly mild pneumonia, depending on the  patient's symptoms. Electronically Signed   By: Roanna Raider M.D.   On: 04/08/2017 00:54   Ct Angio Chest Pe W And/or Wo Contrast  Result Date: 04/08/2017 CLINICAL DATA:  Acute onset of left lateral muscle spasms and orthopnea. Initial encounter. EXAM: CT ANGIOGRAPHY CHEST WITH CONTRAST TECHNIQUE: Multidetector CT imaging of the chest was performed using the standard protocol during bolus administration of intravenous contrast. Multiplanar CT image reconstructions and MIPs were obtained to evaluate the vascular anatomy. CONTRAST:  51 mL of Isovue 370 IV contrast COMPARISON:  Chest radiograph performed earlier today at 12:36 a.m. FINDINGS: Cardiovascular: There is pulmonary embolus within the pulmonary artery to the left lower lobe and left lingula. The RV/LV ratio is 1.3, corresponding to right heart strain and at least submassive pulmonary embolus. The heart remains normal in size. The thoracic aorta is unremarkable in appearance. No calcific atherosclerotic disease is seen. The great vessels are unremarkable in appearance. Mediastinum/Nodes: The mediastinum is unremarkable in appearance. No mediastinal lymphadenopathy is seen. No pericardial effusion is identified. The visualized portions of the thyroid gland are unremarkable. No axillary lymphadenopathy is seen. Lungs/Pleura: Partial consolidation of the left  lower lobe is concerning for pulmonary infarct. Mild right basilar atelectasis is noted. No significant pleural effusion or pneumothorax is seen. No dominant mass is identified. Upper Abdomen: The visualized portions of the liver and spleen are unremarkable. Musculoskeletal: No acute osseous abnormalities are identified. Anterior bridging osteophytes are noted along the thoracic spine. The visualized musculature is unremarkable in appearance. Review of the MIP images confirms the above findings. IMPRESSION: 1. Pulmonary embolus within the pulmonary artery to the left lower lobe and left lingula. CT  evidence of right heart strain (RV/LV Ratio = 1.3) consistent with at least submassive (intermediate risk) PE. The presence of right heart strain has been associated with an increased risk of morbidity and mortality. Please activate Code PE by paging [phone]. 2. Pulmonary infarct noted at the left lower lung lobe. Mild right basilar atelectasis noted. Critical Value/emergent results were called by telephone at the time of interpretation on 04/08/2017 at 5:03 am to Dr. Zadie Rhine, who verbally acknowledged these results. Electronically Signed   By: Roanna Raider M.D.   On: 04/08/2017 05:05    Procedures Procedures   CRITICAL CARE Performed by: Joya Gaskins Total critical care time: 33 minutes Critical care time was exclusive of separately billable procedures and treating other patients. Critical care was necessary to treat or prevent imminent or life-threatening deterioration. Critical care was time spent personally by me on the following activities: development of treatment plan with patient and/or surrogate as well as nursing, discussions with consultants, evaluation of patient's response to treatment, examination of patient, obtaining history from patient or surrogate, ordering and performing treatments and interventions, ordering and review of laboratory studies, ordering and review of radiographic studies, pulse oximetry and re-evaluation of patient's condition. PATIENT FOUND TO HAVE PULMONARY EMBOLISM REQUIRING IV HEPARIN AND ADMISSION  Medications Ordered in ED Medications  heparin bolus via infusion 5,000 Units (not administered)  heparin ADULT infusion 100 units/mL (25000 units/247mL sodium chloride 0.45%) (not administered)  iopamidol (ISOVUE-370) 76 % injection (100 mLs  Contrast Given 04/08/17 0451)     Initial Impression / Assessment and Plan / ED Course  I have reviewed the triage vital signs and the nursing notes.  Pertinent labs & imaging results that were  available during my care of the patient were reviewed by me and considered in my medical decision making (see chart for details).     4:34 AM Pt at risk for PE given recent hospitalization Pain is worse with deep breathing Will proceed with CT chest 5:35 AM Pt found to have PE He is awake/alert, no distress, no hypoxia noted No hypotension He denies recent GI bleed, will start heparin  D/w dr danford for admission I updated patient/wife on plan   Final Clinical Impressions(s) / ED Diagnoses   Final diagnoses:  Other acute pulmonary embolism without acute cor pulmonale (HCC)    New Prescriptions New Prescriptions   No medications on file     Zadie Rhine, MD 04/08/17 [phone]

## 2017-04-08 NOTE — ED Notes (Signed)
Pt finished his lunch

## 2017-04-08 NOTE — ED Notes (Signed)
Pt given urinal.

## 2017-04-09 ENCOUNTER — Encounter: Payer: Self-pay | Admitting: *Deleted

## 2017-04-09 ENCOUNTER — Inpatient Hospital Stay (HOSPITAL_COMMUNITY): Payer: PPO

## 2017-04-09 DIAGNOSIS — I2609 Other pulmonary embolism with acute cor pulmonale: Secondary | ICD-10-CM

## 2017-04-09 DIAGNOSIS — Z794 Long term (current) use of insulin: Secondary | ICD-10-CM

## 2017-04-09 DIAGNOSIS — I10 Essential (primary) hypertension: Secondary | ICD-10-CM

## 2017-04-09 DIAGNOSIS — E871 Hypo-osmolality and hyponatremia: Secondary | ICD-10-CM

## 2017-04-09 DIAGNOSIS — E119 Type 2 diabetes mellitus without complications: Secondary | ICD-10-CM

## 2017-04-09 LAB — BASIC METABOLIC PANEL
Anion gap: 9 (ref 5–15)
BUN: 13 mg/dL (ref 6–20)
CO2: 26 mmol/L (ref 22–32)
Calcium: 8.9 mg/dL (ref 8.9–10.3)
Chloride: 100 mmol/L — ABNORMAL LOW (ref 101–111)
Creatinine, Ser: 0.86 mg/dL (ref 0.61–1.24)
GFR calc Af Amer: >60 mL/min (ref >60)
GFR calc non Af Amer: >60 mL/min (ref >60)
Glucose, Bld: 173 mg/dL — ABNORMAL HIGH (ref 65–99)
Potassium: 3.5 mmol/L (ref 3.5–5.1)
Sodium: 135 mmol/L (ref 135–145)

## 2017-04-09 LAB — CBC
HCT: 39.3 % (ref 39.0–52.0)
Hemoglobin: 12.8 g/dL — ABNORMAL LOW (ref 13.0–17.0)
MCH: 26.1 pg (ref 26.0–34.0)
MCHC: 32.6 g/dL (ref 30.0–36.0)
MCV: 80.2 fL (ref 78.0–100.0)
Platelets: 168 10*3/uL (ref 150–400)
RBC: 4.9 MIL/uL (ref 4.22–5.81)
RDW: 13.2 % (ref 11.5–15.5)
WBC: 11.7 10*3/uL — ABNORMAL HIGH (ref 4.0–10.5)

## 2017-04-09 LAB — GLUCOSE, CAPILLARY
Glucose-Capillary: 159 mg/dL — ABNORMAL HIGH (ref 65–99)
Glucose-Capillary: 182 mg/dL — ABNORMAL HIGH (ref 65–99)
Glucose-Capillary: 249 mg/dL — ABNORMAL HIGH (ref 65–99)
Glucose-Capillary: 287 mg/dL — ABNORMAL HIGH (ref 65–99)

## 2017-04-09 LAB — ECHOCARDIOGRAM COMPLETE
Height: 67 in
Weight: 2699.2 oz

## 2017-04-09 LAB — HEPARIN LEVEL (UNFRACTIONATED)
Heparin Unfractionated: 0.46 IU/mL (ref 0.30–0.70)
Heparin Unfractionated: 0.51 IU/mL (ref 0.30–0.70)

## 2017-04-09 MED ORDER — SODIUM CHLORIDE 0.9 % IV BOLUS (SEPSIS)
250.0000 mL | Freq: Once | INTRAVENOUS | Status: AC
  Administered 2017-04-09: 250 mL via INTRAVENOUS

## 2017-04-09 MED ORDER — SODIUM CHLORIDE 0.9 % IV BOLUS (SEPSIS)
500.0000 mL | Freq: Once | INTRAVENOUS | Status: AC
  Administered 2017-04-09: 500 mL via INTRAVENOUS

## 2017-04-09 MED ORDER — HEPARIN BOLUS VIA INFUSION
2000.0000 [IU] | Freq: Once | INTRAVENOUS | Status: AC
  Administered 2017-04-09: 2000 [IU] via INTRAVENOUS
  Filled 2017-04-09: qty 2000

## 2017-04-09 NOTE — Progress Notes (Signed)
Notified Dr Ella JubileeArrien Eliquis covered

## 2017-04-09 NOTE — Progress Notes (Signed)
ANTICOAGULATION CONSULT NOTE - Follow Up Consult  Pharmacy Consult for heparin Indication: pulmonary embolus  Labs:  Recent Labs  04/08/17 0025 04/08/17 0358 04/08/17 1224 04/08/17 1234 04/08/17 2222  HGB 14.9  --   --   --   --   HCT 43.1  --   --   --   --   PLT 171  --   --   --   --   APTT  --  34 72*  --   --   LABPROT  --  14.4 14.8  --   --   INR  --  1.11 1.16  --   --   HEPARINUNFRC  --   --   --  0.29* 0.25*  CREATININE 0.83  --   --   --   --     Assessment: 66yo male now w/ lower heparin level despite rate increase, no gtt issues per RN.  Goal of Therapy:  Heparin level 0.3-0.7 units/ml   Plan:  Will rebolus with heparin 2000 units and increase gtt by 3-4 units/kg/hr to 1700 units/hr and check level in 6hr.  Vernard GamblesVeronda Kaya Klausing, PharmD, BCPS  04/09/2017,12:06 AM

## 2017-04-09 NOTE — Progress Notes (Signed)
ANTICOAGULATION CONSULT NOTE - Follow Up Consult  Pharmacy Consult for Heparin  Indication: pulmonary embolus  Allergies  Allergen Reactions  . Amlodipine Other (See Comments)    "Feet Swelling"  . Penicillins Other (See Comments)    unknown    Patient Measurements: Height: 5\' 7"  (170.2 cm) Weight: 168 lb 11.2 oz (76.5 kg) (scale a) IBW/kg (Calculated) : 66.1  Vital Signs: Temp: 98.9 F (37.2 C) (08/16 1225) Temp Source: Oral (08/16 1225) BP: 99/58 (08/16 1225) Pulse Rate: 71 (08/16 1225)  Labs:  Recent Labs  04/08/17 0025 04/08/17 0358 04/08/17 1224  04/08/17 2222 04/09/17 0430 04/09/17 1054  HGB 14.9  --   --   --   --  12.8*  --   HCT 43.1  --   --   --   --  39.3  --   PLT 171  --   --   --   --  168  --   APTT  --  34 72*  --   --   --   --   LABPROT  --  14.4 14.8  --   --   --   --   INR  --  1.11 1.16  --   --   --   --   HEPARINUNFRC  --   --   --   < > 0.25* 0.46 0.51  CREATININE 0.83  --   --   --   --  0.86  --   < > = values in this interval not displayed.  Estimated Creatinine Clearance: 79 mL/min (by C-G formula based on SCr of 0.86 mg/dL).   Assessment: On heparin for new onset PE, heparin level = 0.51,  therapeutic x 2 on Heparin drip 1700 units/hr.  No bleeding noted.  Admit CBC wnl, hgb 14.9>12.8, pltc 168 stable    Goal of Therapy:  Heparin level 0.3-0.7 units/ml Monitor platelets by anticoagulation protocol: Yes   Plan:  -Cont heparin at 1700 units/hr Daily HL, CBC F/u for transition to oral anticoagulation   Thank you for allowing pharmacy to be part of this patients care team.  Noah Delaineuth Jermell Holeman, RPh Clinical Pharmacist Pager: [phone]-[phone]-10P [phone] Main Pharmacy [phone] 04/09/2017,1:39 PM

## 2017-04-09 NOTE — Progress Notes (Signed)
Nutrition Brief Note  Patient identified on the Malnutrition Screening Tool (MST) Report  Wt Readings from Last 15 Encounters:  04/09/17 168 lb 11.2 oz (76.5 kg)  03/28/17 161 lb (3173 kg)   66 year old male who presented with chest pain. Patient is known to have hypertension and insulin-dependent diabetes mellitus. Patient was recently hospitalized for hyperosmolar state, he was hospitalized for 3 days, discharged on insulin therapy. 48 hours prior to this hospitalization he had left-sided chest pain, worse when supine, moderate to severe intensity, associated with a dry cough but no hemoptysis.  Spoke with pt, who reports feeling overwhelmed with new DM diagnosis and is doing his best to make lifestyle changes needed to improve glycemic control. He shares that he consumes 3 meals per day (Breakfast: grits and eggs with ham, Lunch: sandwich, Dinner: meat, starch, and vegetable). He has decreased sugar sweetened beverages in his diet- consuming mainly unsweetened tea and Sprite Zero. He prefers not to use artificial sweeteners. His appetite is currently good.   Pt shares he experienced a 12# (6.7%) wt loss prior to last hospitalization due to new DM diagnosis. He shares UBW is around 175# and feels comfortable with his current weight, sharing he is now trying to maintain his wt.   Nutrition-Focused physical exam completed. Findings are no fat depletion, no muscle depletion, and no edema.   Reviewed principles of DM management and diet with pt; provided reassurance to pt that he was making appropriate changes and progress to assist with optimizing glycemic control. PTA DM medications are insulin aspart 5 units TID and 15 units insulin glargine BID. Current orders for glycemic control are 0-15 units insulin aspart TID with meals, 0-5 units insulin aspart q HS, and 15 units insulin glargine BID. Last Hgb A1c: 14.5 (03/28/17).  Body mass index is 26.42 kg/m. Patient meets criteria for overweight based on  current BMI.   Current diet order is Carb Modified, patient is consuming approximately 25-75% of meals at this time. Labs and medications reviewed.   No nutrition interventions warranted at this time. If nutrition issues arise, please consult RD.   Odaliz Mcqueary A. Mayford KnifeWilliams, RD, LDN, CDE Pager: [phone] After hours Pager: [phone]

## 2017-04-09 NOTE — Consult Note (Signed)
   [facility] CM Inpatient Consult   04/09/2017  Todd Avila 01/20/1951 [id number]  Patient newly active with [facility] [employer] in the [employer] plan.  Patient re-admission assessed for acute pulmonary embolism and with newly diagnosed Diabetes Mellitus Type 2.  Met with the patient at the bedside.  Patient states he has been so sick and hoping to get his "health back on track". Active consent on file.  Patient has contact information at home he says.  Patient will have follow up with [facility].  Patient's transition of care is on the list for his primary care providers at [facility] practice. Will contact [place] office of patient being newly active with [facility] [employer].  For questions, please contact:  Natividad Brood, RN BSN [facility] Liaison  [phone] business mobile phone Toll free office [phone]

## 2017-04-09 NOTE — Progress Notes (Signed)
Soft BP, Md notified, bolus

## 2017-04-09 NOTE — Progress Notes (Signed)
ANTICOAGULATION CONSULT NOTE - Follow Up Consult  Pharmacy Consult for Heparin  Indication: pulmonary embolus  Allergies  Allergen Reactions  . Amlodipine Other (See Comments)    "Feet Swelling"  . Penicillins Other (See Comments)    unknown    Patient Measurements: Height: 5\' 7"  (170.2 cm) Weight: 168 lb 11.2 oz (76.5 kg) (scale a) IBW/kg (Calculated) : 66.1  Vital Signs: Temp: 98.5 F (36.9 C) (08/16 0459) Temp Source: Oral (08/16 0459) BP: 101/68 (08/16 0459) Pulse Rate: 84 (08/16 0459)  Labs:  Recent Labs  04/08/17 0025 04/08/17 0358 04/08/17 1224 04/08/17 1234 04/08/17 2222 04/09/17 0430  HGB 14.9  --   --   --   --  12.8*  HCT 43.1  --   --   --   --  39.3  PLT 171  --   --   --   --  168  APTT  --  34 72*  --   --   --   LABPROT  --  14.4 14.8  --   --   --   INR  --  1.11 1.16  --   --   --   HEPARINUNFRC  --   --   --  0.29* 0.25* 0.46  CREATININE 0.83  --   --   --   --  0.86    Estimated Creatinine Clearance: 79 mL/min (by C-G formula based on SCr of 0.86 mg/dL).   Assessment: On heparin for new onset PE, heparin level therapeutic x 1 after rate increase  Goal of Therapy:  Heparin level 0.3-0.7 units/ml Monitor platelets by anticoagulation protocol: Yes   Plan:  -Cont heparin at 1700 units/hr -1200 HL  Abran DukeLedford, Shameca Landen 04/09/2017,6:00 AM

## 2017-04-09 NOTE — Progress Notes (Signed)
PROGRESS NOTE    Todd HaberStephan Avila  ZOX:[id number] DOB: 10/07/1950 DOA: 04/08/2017 PCP: Todd PalmerWolters, Sharon, MD    Brief Narrative:  66 year old male who presented with chest pain. Patient is known to have hypertension and insulin-dependent diabetes mellitus. Patient was recently hospitalized for hyperosmolar state, he was hospitalized for 3 days, discharged on insulin therapy. 48 hours prior to this hospitalization he had left-sided chest pain, worse when supine, moderate to severe intensity, associated with a dry cough but no hemoptysis. On initial physical examination temperature 99.2, heart rate 99, respiratory 25, blood pressure 103/70, oxygen 95% room air. Moist mucous membranes, lungs were clear to auscultation bilaterally, no wheezing, rales or rhonchi, heart S1-S2 present rhythmic, no gallops or murmurs. Abdomen was soft nontender, low sugars no edema. Sodium 130, potassium 3.7, chloride 98, bicarbonate 23, glucose 281, BUN 10, creatinine 0.83, white count 12.6, hemoglobin 14.9, hematocrit 43.1, platelets 171. D-dimer 1.74. Chest x-ray with left lower lobe atelectasis. EKG with normal sinus rhythm, normal axis and normal intervals. CT chest with the pulmonary embolism within the pulmonary artery to the left lower lobe and left lingula. Pulmonary infarct at the left lower lobe.   The patient was admitted to the hospital with working diagnosis of acute pulmonary embolism.   Assessment & Plan:   Principal Problem:   Pulmonary embolism (HCC) Active Problems:   Type 2 diabetes mellitus without complication, with long-term current use of insulin (HCC)   HTN (hypertension)   Hyponatremia   1. Acute pulmonary embolism with NEW Hypotension. Blood pressure noted to be low 94/49 and 99/58, will hold on antihypertensive agents and will give a 250 ml saline bolus, will follow on echocardiography. Lower extremity dopplers negative for DVT, patient with no dyspnea or chest pain.   2. Insulin dependent  DM. Capillary glucose 213, 233, 159, 182. will continue glucose cover and monitoring with insulin sliding scale,and basal insulin regimen with 15 units of glargine.   3. HTN. Hold on HCTZ and losartan, to prevent worsening hypotension.   4. Hyponatremia. Serum Na up to 135 from 130, will continue close monitoring of electrolytes.    DVT prophylaxis: IV heparin  Code Status: Full  Family Communication:  Disposition Plan:    Consultants:     Procedures:   Antimicrobials:     Subjective: Out of bed as tolerated, no further chest pain or dyspnea, no nausea or vomiting.   Objective: Vitals:   04/08/17 1922 04/08/17 2330 04/09/17 0459 04/09/17 0835  BP: (!) 90/52 97/64 101/68 (!) 94/49  Pulse: 91 81 84 85  Resp: 18 18 18 18   Temp: (!) 100.4 F (38 C) 98.6 F (37 C) 98.5 F (36.9 C) 99.4 F (37.4 C)  TempSrc: Oral Oral Oral Oral  SpO2: 97% 98% 97% 96%  Weight:   76.5 kg (168 lb 11.2 oz)   Height:        Intake/Output Summary (Last 24 hours) at 04/09/17 1102 Last data filed at 04/09/17 0809  Gross per 24 hour  Intake          1153.27 ml  Output             1275 ml  Net          -121.73 ml   Filed Weights   04/08/17 0500 04/08/17 1420 04/09/17 0459  Weight: 73 kg (160 lb 15 oz) 76.5 kg (168 lb 11.2 oz) 76.5 kg (168 lb 11.2 oz)    Examination:  General exam: not in pain or distress  E ENT: no pallor or icterus, oral mucosa moist.  Respiratory system: No wheezing, rales or rhonchi. Respiratory effort normal. Cardiovascular system: S1 & S2 heard, RRR. No JVD, murmurs, rubs, gallops or clicks. No pedal edema. Gastrointestinal system: Abdomen is nondistended, soft and nontender. No organomegaly or masses felt. Normal bowel sounds heard. Central nervous system: Alert and oriented. No focal neurological deficits. Extremities: Symmetric 5 x 5 power. Skin: No rashes, lesions or ulcers    Data Reviewed: I have personally reviewed following labs and imaging  studies  CBC:  Recent Labs Lab 04/08/17 0025 04/09/17 0430  WBC 12.6* 11.7*  HGB 14.9 12.8*  HCT 43.1 39.3  MCV 78.9 80.2  PLT 171 168   Basic Metabolic Panel:  Recent Labs Lab 04/08/17 0025 04/09/17 0430  NA 130* 135  K 3.7 3.5  CL 98* 100*  CO2 23 26  GLUCOSE 281* 173*  BUN 10 13  CREATININE 0.83 0.86  CALCIUM 8.8* 8.9   GFR: Estimated Creatinine Clearance: 79 mL/min (by C-G formula based on SCr of 0.86 mg/dL). Liver Function Tests: No results for input(s): AST, ALT, ALKPHOS, BILITOT, PROT, ALBUMIN in the last 168 hours. No results for input(s): LIPASE, AMYLASE in the last 168 hours. No results for input(s): AMMONIA in the last 168 hours. Coagulation Profile:  Recent Labs Lab 04/08/17 0358 04/08/17 1224  INR 1.11 1.16   Cardiac Enzymes: No results for input(s): CKTOTAL, CKMB, CKMBINDEX, TROPONINI in the last 168 hours. BNP (last 3 results) No results for input(s): PROBNP in the last 8760 hours. HbA1C: No results for input(s): HGBA1C in the last 72 hours. CBG:  Recent Labs Lab 04/08/17 0810 04/08/17 1337 04/08/17 1813 04/08/17 2100 04/09/17 0747  GLUCAP 235* 227* 213* 223* 159*   Lipid Profile: No results for input(s): CHOL, HDL, LDLCALC, TRIG, CHOLHDL, LDLDIRECT in the last 72 hours. Thyroid Function Tests: No results for input(s): TSH, T4TOTAL, FREET4, T3FREE, THYROIDAB in the last 72 hours. Anemia Panel: No results for input(s): VITAMINB12, FOLATE, FERRITIN, TIBC, IRON, RETICCTPCT in the last 72 hours. Sepsis Labs: No results for input(s): PROCALCITON, LATICACIDVEN in the last 168 hours.  No results found for this or any previous visit (from the past 240 hour(s)).       Radiology Studies: Dg Chest 2 View  Result Date: 04/08/2017 CLINICAL DATA:  Acute onset of right lower chest spasms. Initial encounter. EXAM: CHEST  2 VIEW COMPARISON:  Chest radiograph performed 03/28/2017 FINDINGS: Mild bibasilar airspace opacities may reflect  atelectasis or possibly mild pneumonia. No pleural effusion or pneumothorax is seen. The cardiomediastinal silhouette is normal in size. No acute osseous abnormalities are identified. IMPRESSION: No displaced rib fracture seen. Mild bibasilar airspace opacities may reflect atelectasis or possibly mild pneumonia, depending on the patient's symptoms. Electronically Signed   By: Roanna Raider M.D.   On: 04/08/2017 00:54   Ct Angio Chest Pe W And/or Wo Contrast  Result Date: 04/08/2017 CLINICAL DATA:  Acute onset of left lateral muscle spasms and orthopnea. Initial encounter. EXAM: CT ANGIOGRAPHY CHEST WITH CONTRAST TECHNIQUE: Multidetector CT imaging of the chest was performed using the standard protocol during bolus administration of intravenous contrast. Multiplanar CT image reconstructions and MIPs were obtained to evaluate the vascular anatomy. CONTRAST:  51 mL of Isovue 370 IV contrast COMPARISON:  Chest radiograph performed earlier today at 12:36 a.m. FINDINGS: Cardiovascular: There is pulmonary embolus within the pulmonary artery to the left lower lobe and left lingula. The RV/LV ratio is 1.3, corresponding to right  heart strain and at least submassive pulmonary embolus. The heart remains normal in size. The thoracic aorta is unremarkable in appearance. No calcific atherosclerotic disease is seen. The great vessels are unremarkable in appearance. Mediastinum/Nodes: The mediastinum is unremarkable in appearance. No mediastinal lymphadenopathy is seen. No pericardial effusion is identified. The visualized portions of the thyroid gland are unremarkable. No axillary lymphadenopathy is seen. Lungs/Pleura: Partial consolidation of the left lower lobe is concerning for pulmonary infarct. Mild right basilar atelectasis is noted. No significant pleural effusion or pneumothorax is seen. No dominant mass is identified. Upper Abdomen: The visualized portions of the liver and spleen are unremarkable. Musculoskeletal: No  acute osseous abnormalities are identified. Anterior bridging osteophytes are noted along the thoracic spine. The visualized musculature is unremarkable in appearance. Review of the MIP images confirms the above findings. IMPRESSION: 1. Pulmonary embolus within the pulmonary artery to the left lower lobe and left lingula. CT evidence of right heart strain (RV/LV Ratio = 1.3) consistent with at least submassive (intermediate risk) PE. The presence of right heart strain has been associated with an increased risk of morbidity and mortality. Please activate Code PE by paging [phone]. 2. Pulmonary infarct noted at the left lower lung lobe. Mild right basilar atelectasis noted. Critical Value/emergent results were called by telephone at the time of interpretation on 04/08/2017 at 5:03 am to Dr. Zadie Rhine, who verbally acknowledged these results. Electronically Signed   By: Roanna Raider M.D.   On: 04/08/2017 05:05        Scheduled Meds: . feeding supplement (ENSURE ENLIVE)  237 mL Oral BID BM  . insulin aspart  0-15 Units Subcutaneous TID WC  . insulin aspart  0-5 Units Subcutaneous QHS  . insulin glargine  15 Units Subcutaneous BID AC & HS   Continuous Infusions: . heparin 1,700 Units/hr (04/09/17 0006)  . sodium chloride       LOS: 1 day      Coralie Keens, MD [facility] Pager [phone]  If 7PM-7AM, please contact night-coverage [email or web address] Password [email or web address] 04/09/2017, 11:02 AM

## 2017-04-09 NOTE — Care Management Note (Signed)
Case Management Note  Patient Details  Name: Todd Avila MRN: [id number] Date of Birth: 04/09/1951  Subjective/Objective:                 SPoke w patient at bedside. Provided with Eliquis 30 day free card. Patient expressed concern paying for health care and medications. Discussed medicaid application, CM provided patient with paper application, and also [facility] Patient appreciative. No other CM needs identified at this time.    Action/Plan:   Expected Discharge Date:                  Expected Discharge Plan:  Home/Self Care  In-House Referral:     Discharge planning Services  CM Consult  Post Acute Care Choice:    Choice offered to:     DME Arranged:    DME Agency:     HH Arranged:    HH Agency:     Status of Service:  In process, will continue to follow  If discussed at Long Length of Stay Meetings, dates discussed:    Additional Comments:  Lawerance SabalDebbie Dempsey Knotek, RN 04/09/2017, 11:11 AM

## 2017-04-09 NOTE — Progress Notes (Signed)
Benefits check results: Eliquis 5mg . BID $45.00 co-pay,No Prior Auth required ,[facility]. [facility].  Xarelto didn't come up on the formulary.

## 2017-04-10 DIAGNOSIS — I2699 Other pulmonary embolism without acute cor pulmonale: Principal | ICD-10-CM

## 2017-04-10 LAB — CBC WITH DIFFERENTIAL/PLATELET
Basophils Absolute: 0 10*3/uL (ref 0.0–0.1)
Basophils Relative: 0 %
Eosinophils Absolute: 0.1 10*3/uL (ref 0.0–0.7)
Eosinophils Relative: 1 %
HCT: 37.5 % — ABNORMAL LOW (ref 39.0–52.0)
Hemoglobin: 12.4 g/dL — ABNORMAL LOW (ref 13.0–17.0)
Lymphocytes Relative: 21 %
Lymphs Abs: 2 10*3/uL (ref 0.7–4.0)
MCH: 26 pg (ref 26.0–34.0)
MCHC: 33.1 g/dL (ref 30.0–36.0)
MCV: 78.6 fL (ref 78.0–100.0)
Monocytes Absolute: 0.7 10*3/uL (ref 0.1–1.0)
Monocytes Relative: 7 %
Neutro Abs: 6.5 10*3/uL (ref 1.7–7.7)
Neutrophils Relative %: 71 %
Platelets: 164 10*3/uL (ref 150–400)
RBC: 4.77 MIL/uL (ref 4.22–5.81)
RDW: 12.8 % (ref 11.5–15.5)
WBC: 9.2 10*3/uL (ref 4.0–10.5)

## 2017-04-10 LAB — BASIC METABOLIC PANEL
Anion gap: 8 (ref 5–15)
BUN: 13 mg/dL (ref 6–20)
CO2: 23 mmol/L (ref 22–32)
Calcium: 8.5 mg/dL — ABNORMAL LOW (ref 8.9–10.3)
Chloride: 104 mmol/L (ref 101–111)
Creatinine, Ser: 0.91 mg/dL (ref 0.61–1.24)
GFR calc Af Amer: >60 mL/min (ref >60)
GFR calc non Af Amer: >60 mL/min (ref >60)
Glucose, Bld: 200 mg/dL — ABNORMAL HIGH (ref 65–99)
Potassium: 3.6 mmol/L (ref 3.5–5.1)
Sodium: 135 mmol/L (ref 135–145)

## 2017-04-10 LAB — GLUCOSE, CAPILLARY
Glucose-Capillary: 180 mg/dL — ABNORMAL HIGH (ref 65–99)
Glucose-Capillary: 217 mg/dL — ABNORMAL HIGH (ref 65–99)

## 2017-04-10 LAB — HEPARIN LEVEL (UNFRACTIONATED): Heparin Unfractionated: 0.39 IU/mL (ref 0.30–0.70)

## 2017-04-10 MED ORDER — APIXABAN 5 MG PO TABS: 5.0000 mg | ORAL_TABLET | Freq: Two times a day (BID) | ORAL | Status: DC

## 2017-04-10 MED ORDER — APIXABAN 5 MG PO TABS: 10.0000 mg | ORAL_TABLET | Freq: Two times a day (BID) | ORAL | Status: DC

## 2017-04-10 MED ORDER — APIXABAN 5 MG PO TABS: ORAL_TABLET | ORAL | 0 refills | Status: DC

## 2017-04-10 MED ORDER — APIXABAN 5 MG PO TABS
10.0000 mg | ORAL_TABLET | Freq: Two times a day (BID) | ORAL | Status: DC
  Administered 2017-04-10: 10 mg via ORAL
  Filled 2017-04-10: qty 2

## 2017-04-10 NOTE — Discharge Instructions (Signed)
Information on my medicine - ELIQUIS (apixaban)  This medication education was reviewed with me or my healthcare representative/pharmacist as part of my discharge preparation.   Why was Eliquis prescribed for you? Eliquis was prescribed to treat blood clots that may have been found in the veins of your legs (deep vein thrombosis) or in your lungs (pulmonary embolism) and to reduce the risk of them occurring again.  What do You need to know about Eliquis ? The starting dose is 10 mg (two 5 mg tablets) taken TWICE daily for the FIRST SEVEN (7) DAYS, then on 04/17/2017  the dose is reduced to ONE 5 mg tablet taken TWICE daily.  Eliquis may be taken with or without food.   Try to take the dose about the same time in the morning and in the evening. If you have difficulty swallowing the tablet whole please discuss with your pharmacist how to take the medication safely.  Take Eliquis exactly as prescribed and DO NOT stop taking Eliquis without talking to the doctor who prescribed the medication.  Stopping may increase your risk of developing a new blood clot.  Refill your prescription before you run out.  After discharge, you should have regular check-up appointments with your healthcare provider that is prescribing your Eliquis.    What do you do if you miss a dose? If a dose of ELIQUIS is not taken at the scheduled time, take it as soon as possible on the same day and twice-daily administration should be resumed. The dose should not be doubled to make up for a missed dose.  Important Safety Information A possible side effect of Eliquis is bleeding. You should call your healthcare provider right away if you experience any of the following: ? Bleeding from an injury or your nose that does not stop. ? Unusual colored urine (red or dark brown) or unusual colored stools (red or black). ? Unusual bruising for unknown reasons. ? A serious fall or if you hit your head (even if there is no  bleeding).  Some medicines may interact with Eliquis and might increase your risk of bleeding or clotting while on Eliquis. To help avoid this, consult your healthcare provider or pharmacist prior to using any new prescription or non-prescription medications, including herbals, vitamins, non-steroidal anti-inflammatory drugs (NSAIDs) and supplements.  This website has more information on Eliquis (apixaban): http://www.eliquis.com/eliquis/home

## 2017-04-10 NOTE — Progress Notes (Signed)
Patient given discharge instructions and all questions answered.  

## 2017-04-10 NOTE — Progress Notes (Signed)
ANTICOAGULATION CONSULT NOTE - Follow Up Consult  Pharmacy Consult for Heparin -  Switch to Eliquis  Indication: pulmonary embolus  Allergies  Allergen Reactions  . Amlodipine Other (See Comments)    "Feet Swelling"  . Penicillins Other (See Comments)    unknown    Patient Measurements: Height: 5\' 7"  (170.2 cm) Weight: 168 lb 9.6 oz (76.5 kg) IBW/kg (Calculated) : 66.1  Vital Signs: Temp: 98.6 F (37 C) (08/17 0425) Temp Source: Oral (08/17 0425) BP: 91/51 (08/17 0425) Pulse Rate: 83 (08/17 0425)  Labs:  Recent Labs  04/08/17 0025 04/08/17 0358 04/08/17 1224  04/09/17 0430 04/09/17 1054 04/10/17 0458  HGB 14.9  --   --   --  12.8*  --  12.4*  HCT 43.1  --   --   --  39.3  --  37.5*  PLT 171  --   --   --  168  --  164  APTT  --  34 72*  --   --   --   --   LABPROT  --  14.4 14.8  --   --   --   --   INR  --  1.11 1.16  --   --   --   --   HEPARINUNFRC  --   --   --   < > 0.46 0.51 0.39  CREATININE 0.83  --   --   --  0.86  --  0.91  < > = values in this interval not displayed.  Estimated Creatinine Clearance: 74.7 mL/min (by C-G formula based on SCr of 0.91 mg/dL).   Assessment: On heparin for new onset PE,, on IV heparin 1700 units/hr with therapeutic heparin level = 0.39 this AM. Now transitioning to Eliquis treatment dose.  Discontinue IV heparin drip now. CBC wnl, hgb low/stable 12.4, pltc 164 stable. SCr wnl.  No bleeding noted   Plan:  Discontinue IV heparin, start Eliquis 10mg  bid x7 days then on 04/17/17 reduce to 5mg  bid.  I will educate patient about his Eliquis.  Case manager has already provided with Eliquis 30 day free card.   Thank you for allowing pharmacy to be part of this patients care team.  Noah Delaine, RPh Clinical Pharmacist Pager: [phone] 8A-4P [phone] 4P-10P [phone] Main Pharmacy [phone] 04/10/2017,12:12 PM

## 2017-04-10 NOTE — Discharge Summary (Signed)
Physician Discharge Summary  Todd Avila YKZ:[id number] DOB: 17-Jun-1951 DOA: 04/08/2017  PCP: Todd Palmer, MD  Admit date: 04/08/2017 Discharge date: 04/10/2017  Admitted From: Home  Disposition: Home    Recommendations for Outpatient Follow-up:  1. Follow up with PCP in 1- weeks 2. Patient placed on apixaban for anticoagulation 3. Instructed to hold on blood pressure medications, until blood pressure greater than 140/90.   Home Health:Equipment/Devices: No   Discharge Condition: Stable  CODE STATUS: Full  Diet recommendation: Heart Healthy / Carb Modified   Brief/Interim Summary: 66 year old male who presented with chest pain. Patient is known to have hypertension and insulin-dependent diabetes mellitus. Patient was recently hospitalized for hyperosmolar state, he was hospitalized for 3 days, discharged on insulin therapy. 48 hours prior to this hospitalization he had left-sided chest pain, worse when supine, moderate to severe in intensity, associated with a dry cough but no hemoptysis. On initial physical examination temperature 99.2, heart rate 99, respiratory rate 5, blood pressure 103/70, oxygen 95% room air. Moist mucous membranes, lungs were clear to auscultation bilaterally, no wheezing, rales or rhonchi, heart S1-S2 present rhythmic, no gallops or murmurs. Abdomen was soft nontender, low extremity with no edema. Sodium 130, potassium 3.7, chloride 98, bicarbonate 23, glucose 281, BUN 10, creatinine 0.83, white count 12.6, hemoglobin 14.9, hematocrit 43.1, platelets 171. D-dimer 1.74. Chest x-ray with left lower lobe atelectasis. EKG with normal sinus rhythm, normal axis and normal intervals. CT chest with the pulmonary embolism within the pulmonary artery to the left lower lobe and left lingula. Pulmonary infarct at the left lower lobe.   The patient was admitted to the hospital with working diagnosis of acute pulmonary embolism.  1. Acute pulmonary embolism, provoked.  Patient was admitted to the medical ward on a telemetry monitor, he was placed on unfractionated IV heparin for anticoagulation, his antihypertensive agents were held. His blood pressure systolic, range between 90 and 100. He has had low blood pressure on prior hospitalization, systolic in the 90s. Echocardiogram showed normal systolic LV function, ejection fraction 55-60%. The right ventricle was mildly dilated, normal wall thickness and normal systolic function. Doppler ultrasonography of the lower extremities negative for deep vein thrombosis. Patient was started on apixaban, for oral anticoagulation, at the discharge.  2. Insulin-dependent diabetes mellitus. Patient was placed on insulin sliding scale for glucose coverage and monitoring, long-acting insulin with glargine, capillary glucose 159, 182, 249.287, 180.  3. Hypertension. Antihypertensive agents were held, hydrochlorothiazide-losartan to prevent further hypotension. Patient instructed to resume antihypertensive medications once blood pressure is greater than 140/90.   Discharge Diagnoses:  Principal Problem:   Pulmonary embolism (HCC) Active Problems:   Type 2 diabetes mellitus without complication, with long-term current use of insulin (HCC)   HTN (hypertension)   Hyponatremia    Discharge Instructions   Allergies as of 04/10/2017      Reactions   Amlodipine Other (See Comments)   "Feet Swelling"   Penicillins Other (See Comments)   unknown      Medication List    STOP taking these medications   losartan-hydrochlorothiazide 100-12.5 MG tablet Commonly known as:  HYZAAR     TAKE these medications   apixaban 5 MG Tabs tablet Commonly known as:  ELIQUIS Take 2 tablets twice daily for 7 days, then take 1 tablet twice daily.   insulin aspart 100 UNIT/ML injection Commonly known as:  novoLOG Inject 5 Units into the skin 3 (three) times daily with meals. What changed:  how much to take  Insulin Glargine 100 UNIT/ML  Solostar Pen Commonly known as:  LANTUS SOLOSTAR Inject 15 Units into the skin 2 (two) times daily at 8 am and 10 pm.       Allergies  Allergen Reactions  . Amlodipine Other (See Comments)    "Feet Swelling"  . Penicillins Other (See Comments)    unknown    Consultations:     Procedures/Studies: Dg Chest 2 View  Result Date: 04/08/2017 CLINICAL DATA:  Acute onset of right lower chest spasms. Initial encounter. EXAM: CHEST  2 VIEW COMPARISON:  Chest radiograph performed 03/28/2017 FINDINGS: Mild bibasilar airspace opacities may reflect atelectasis or possibly mild pneumonia. No pleural effusion or pneumothorax is seen. The cardiomediastinal silhouette is normal in size. No acute osseous abnormalities are identified. IMPRESSION: No displaced rib fracture seen. Mild bibasilar airspace opacities may reflect atelectasis or possibly mild pneumonia, depending on the patient's symptoms. Electronically Signed   By: Roanna Raider M.D.   On: 04/08/2017 00:54   Ct Head Wo Contrast  Result Date: 03/28/2017 CLINICAL DATA:  66 year old male with altered mental status for 3 days. EXAM: CT HEAD WITHOUT CONTRAST TECHNIQUE: Contiguous axial images were obtained from the base of the skull through the vertex without intravenous contrast. COMPARISON:  None. FINDINGS: Brain: No evidence of acute infarction, hemorrhage, hydrocephalus, extra-axial collection or mass lesion/mass effect. Mild cerebral atrophy noted. Vascular: No hyperdense vessel or unexpected calcification. Skull: Normal. Negative for fracture or focal lesion. Sinuses/Orbits: No acute finding. Other: None. IMPRESSION: No evidence of acute intracranial abnormality. Mild atrophy. Electronically Signed   By: Harmon Pier M.D.   On: 03/28/2017 10:13   Ct Angio Chest Pe W And/or Wo Contrast  Result Date: 04/08/2017 CLINICAL DATA:  Acute onset of left lateral muscle spasms and orthopnea. Initial encounter. EXAM: CT ANGIOGRAPHY CHEST WITH CONTRAST  TECHNIQUE: Multidetector CT imaging of the chest was performed using the standard protocol during bolus administration of intravenous contrast. Multiplanar CT image reconstructions and MIPs were obtained to evaluate the vascular anatomy. CONTRAST:  51 mL of Isovue 370 IV contrast COMPARISON:  Chest radiograph performed earlier today at 12:36 a.m. FINDINGS: Cardiovascular: There is pulmonary embolus within the pulmonary artery to the left lower lobe and left lingula. The RV/LV ratio is 1.3, corresponding to right heart strain and at least submassive pulmonary embolus. The heart remains normal in size. The thoracic aorta is unremarkable in appearance. No calcific atherosclerotic disease is seen. The great vessels are unremarkable in appearance. Mediastinum/Nodes: The mediastinum is unremarkable in appearance. No mediastinal lymphadenopathy is seen. No pericardial effusion is identified. The visualized portions of the thyroid gland are unremarkable. No axillary lymphadenopathy is seen. Lungs/Pleura: Partial consolidation of the left lower lobe is concerning for pulmonary infarct. Mild right basilar atelectasis is noted. No significant pleural effusion or pneumothorax is seen. No dominant mass is identified. Upper Abdomen: The visualized portions of the liver and spleen are unremarkable. Musculoskeletal: No acute osseous abnormalities are identified. Anterior bridging osteophytes are noted along the thoracic spine. The visualized musculature is unremarkable in appearance. Review of the MIP images confirms the above findings. IMPRESSION: 1. Pulmonary embolus within the pulmonary artery to the left lower lobe and left lingula. CT evidence of right heart strain (RV/LV Ratio = 1.3) consistent with at least submassive (intermediate risk) PE. The presence of right heart strain has been associated with an increased risk of morbidity and mortality. Please activate Code PE by paging [phone]. 2. Pulmonary infarct noted at  the left lower lung  lobe. Mild right basilar atelectasis noted. Critical Value/emergent results were called by telephone at the time of interpretation on 04/08/2017 at 5:03 am to Dr. Zadie Rhine, who verbally acknowledged these results. Electronically Signed   By: Roanna Raider M.D.   On: 04/08/2017 05:05   US Renal  Result Date: 03/29/2017 CLINICAL DATA:  Acute kidney injury EXAM: RENAL / URINARY TRACT ULTRASOUND COMPLETE COMPARISON:  None. FINDINGS: Right Kidney: Length: 11.3 cm. Echogenicity within normal limits. No mass or hydronephrosis visualized. Left Kidney: Length: 12.2 cm. Echogenicity within normal limits. No mass or hydronephrosis visualized. Bladder: Large 3 cm ovoid lesion in the lumen of the bladder with posterior shadowing is most consistent with large bladder calculus. Second smaller 1 cm calculus. IMPRESSION: 1. Normal kidneys. 2. Large bladder calculi. Electronically Signed   By: Genevive Bi M.D.   On: 03/29/2017 09:31   Dg Chest Port 1 View  Result Date: 03/28/2017 CLINICAL DATA:  Altered mental status. EXAM: PORTABLE CHEST 1 VIEW COMPARISON:  None. FINDINGS: The cardiomediastinal silhouette is unremarkable. This is a low volume film There is no evidence of focal airspace disease, pulmonary edema, suspicious pulmonary nodule/mass, pleural effusion, or pneumothorax. No acute bony abnormalities are identified. IMPRESSION: No evidence of acute cardiopulmonary disease. Electronically Signed   By: Harmon Pier M.D.   On: 03/28/2017 10:03      Subjective: Patient feeling better, no nausea or vomiting, no chest pain or dyspnea. Out of bed.   Discharge Exam: Vitals:   04/09/17 1925 04/10/17 0425  BP: (!) 93/44 (!) 91/51  Pulse: 89 83  Resp: 18 18  Temp: 99.2 F (37.3 C) 98.6 F (37 C)  SpO2: 97% 96%   Vitals:   04/09/17 0835 04/09/17 1225 04/09/17 1925 04/10/17 0425  BP: (!) 94/49 (!) 99/58 (!) 93/44 (!) 91/51  Pulse: 85 71 89 83  Resp: 18 18 18 18   Temp: 99.4 F  (37.4 C) 98.9 F (37.2 C) 99.2 F (37.3 C) 98.6 F (37 C)  TempSrc: Oral Oral Oral Oral  SpO2: 96% 98% 97% 96%  Weight:    76.5 kg (168 lb 9.6 oz)  Height:        General: Pt is alert, awake, not in acute distress E ENT. No pallor or icterus, oral mucosa moist.  Cardiovascular: RRR, S1/S2 +, no rubs, no gallops Respiratory: CTA bilaterally, no wheezing, no rhonchi Abdominal: Soft, NT, ND, bowel sounds + Extremities: no edema, no cyanosis    The results of significant diagnostics from this hospitalization (including imaging, microbiology, ancillary and laboratory) are listed below for reference.     Microbiology: No results found for this or any previous visit (from the past 240 hour(s)).   Labs: BNP (last 3 results) No results for input(s): BNP in the last 8760 hours. Basic Metabolic Panel:  Recent Labs Lab 04/08/17 0025 04/09/17 0430 04/10/17 0458  NA 130* 135 135  K 3.7 3.5 3.6  CL 98* 100* 104  CO2 23 26 23   GLUCOSE 281* 173* 200*  BUN 10 13 13   CREATININE 0.83 0.86 0.91  CALCIUM 8.8* 8.9 8.5*   Liver Function Tests: No results for input(s): AST, ALT, ALKPHOS, BILITOT, PROT, ALBUMIN in the last 168 hours. No results for input(s): LIPASE, AMYLASE in the last 168 hours. No results for input(s): AMMONIA in the last 168 hours. CBC:  Recent Labs Lab 04/08/17 0025 04/09/17 0430 04/10/17 0458  WBC 12.6* 11.7* 9.2  NEUTROABS  --   --  6.5  HGB  14.9 12.8* 12.4*  HCT 43.1 39.3 37.5*  MCV 78.9 80.2 78.6  PLT 171 168 164   Cardiac Enzymes: No results for input(s): CKTOTAL, CKMB, CKMBINDEX, TROPONINI in the last 168 hours. BNP: Invalid input(s): POCBNP CBG:  Recent Labs Lab 04/09/17 0747 04/09/17 1142 04/09/17 1704 04/09/17 2108 04/10/17 0823  GLUCAP 159* 182* 249* 287* 180*   D-Dimer  Recent Labs  04/08/17 0358  DDIMER 1.74*   Hgb A1c No results for input(s): HGBA1C in the last 72 hours. Lipid Profile No results for input(s): CHOL, HDL,  LDLCALC, TRIG, CHOLHDL, LDLDIRECT in the last 72 hours. Thyroid function studies No results for input(s): TSH, T4TOTAL, T3FREE, THYROIDAB in the last 72 hours.  Invalid input(s): FREET3 Anemia work up No results for input(s): VITAMINB12, FOLATE, FERRITIN, TIBC, IRON, RETICCTPCT in the last 72 hours. Urinalysis    Component Value Date/Time   COLORURINE YELLOW 03/28/2017 0934   APPEARANCEUR CLEAR 03/28/2017 0934   LABSPEC 1.024 03/28/2017 0934   PHURINE 5.0 03/28/2017 0934   GLUCOSEU >=500 (A) 03/28/2017 0934   HGBUR LARGE (A) 03/28/2017 0934   BILIRUBINUR NEGATIVE 03/28/2017 0934   KETONESUR 5 (A) 03/28/2017 0934   PROTEINUR NEGATIVE 03/28/2017 0934   NITRITE NEGATIVE 03/28/2017 0934   LEUKOCYTESUR NEGATIVE 03/28/2017 0934   Sepsis Labs Invalid input(s): PROCALCITONIN,  WBC,  LACTICIDVEN Microbiology No results found for this or any previous visit (from the past 240 hour(s)).   Time coordinating discharge: 45 minutes  SIGNED:   Coralie Keens, MD  [facility] 04/10/2017, 12:06 PM Pager   If 7PM-7AM, please contact night-coverage [email or web address] Password [email or web address]

## 2017-04-17 ENCOUNTER — Other Ambulatory Visit: Payer: Self-pay | Admitting: *Deleted

## 2017-04-17 NOTE — Patient Outreach (Signed)
[facility] [place] [facility]) Care Management  04/17/2017  Alister Schwass 1951/03/24 [id number]   Referral Date: [id number] with readmission [id number] Referral Source: Hospital Liasion Referral Reason: Diabetes Insurance:  HTA  RN Health Coach telephone call to patient.  [employer] compliance verified. RN explained the reason for call and [facility] services. Patient stated he would like for RN  to send the information about [facility] services in writing before discussing.  Per patient he will call me back once he has read over the information .  Plan. RN will sent the patient a welcome letter RN will send a Packet to patient RN will follow up with patient within 14 days for any questions  [name] BSN RN [facility] Care Management [phone]

## 2017-04-23 DIAGNOSIS — I10 Essential (primary) hypertension: Secondary | ICD-10-CM | POA: Diagnosis not present

## 2017-04-23 DIAGNOSIS — I2699 Other pulmonary embolism without acute cor pulmonale: Secondary | ICD-10-CM | POA: Diagnosis not present

## 2017-04-23 DIAGNOSIS — E1100 Type 2 diabetes mellitus with hyperosmolarity without nonketotic hyperglycemic-hyperosmolar coma (NKHHC): Secondary | ICD-10-CM | POA: Diagnosis not present

## 2017-04-28 ENCOUNTER — Ambulatory Visit: Payer: Self-pay | Admitting: *Deleted

## 2017-04-28 ENCOUNTER — Other Ambulatory Visit: Payer: Self-pay | Admitting: *Deleted

## 2017-04-28 NOTE — Patient Outreach (Signed)
[facility]) Care Management  04/28/2017   Todd HaberStephan Avila 03/14/1951 [id number]   RN Health Coach received telephone call from patient.  [employer] compliance verified. Per patient he has received the information that was sent to him and he would like to have a Health Coach talk with him monthly and send him educational material. Patient did not know the signs and symptoms of hypo and hyperglycemia. Patient fasting blood sugar today was 124. Patient is taking am and pm lantus insulin. Per patient he is not taking the novolog any more. Patient is on Eliquis due to a blood clot in lungs. Patient stated that he is having some difficulty figuring out the right foods to eat. Patient is scheduled for diabetic nutritional classes. Patient is scheduled for an eye exam on September 14. Per patient he didn't know that the symptoms he had been  experiencing was due to the hyperglycemia. Patient has agreed to follow up outreach calls and education.     Current Medications:  Current Outpatient Prescriptions  Medication Sig Dispense Refill  . apixaban (ELIQUIS) 5 MG TABS tablet Take 2 tablets twice daily for 7 days, then take 1 tablet twice daily. 70 tablet 0  . Insulin Glargine (LANTUS SOLOSTAR) 100 UNIT/ML Solostar Pen Inject 15 Units into the skin 2 (two) times daily at 8 am and 10 pm. 15 mL 11  . insulin aspart (NOVOLOG) 100 UNIT/ML injection Inject 5 Units into the skin 3 (three) times daily with meals. (Patient not taking: Reported on 04/28/2017) 10 mL 11   No current facility-administered medications for this visit.     Functional Status:  In your present state of health, do you have any difficulty performing the following activities: 04/28/2017 04/08/2017  Hearing? N N  Vision? N N  Difficulty concentrating or making decisions? N N  Walking or climbing stairs? N N  Dressing or bathing? N N  Doing errands, shopping? N N  Preparing Food and eating ? N -  Using the Toilet? N -  In the  past six months, have you accidently leaked urine? Y -  Comment Patient hasa bladder stone and has frequency -  Do you have problems with loss of bowel control? N -  Managing your Medications? N -  Managing your Finances? N -  Housekeeping or managing your Housekeeping? N -  Some recent data might be hidden    Fall/Depression Screening: Fall Risk  04/28/2017  Falls in the past year? No   PHQ 2/9 Scores 04/28/2017  PHQ - 2 Score 0   [facility] CM Care Plan Problem One     Most Recent Value  Care Plan Problem One  Knowledge deficit in Self Management of Diabetes  Role Documenting the Problem One  Health Coach  Care Plan for Problem One  Active  [facility] Long Term Goal   Patient will not have any readmissions for diabetes within the next 90 days  [facility] Long Term Goal Start Date  04/28/17  Interventions for Problem One Long Term Goal  RN discussed with patient abiut keeping appointments with Diabetes nutritionist. RN discussed medication adherence. RN discussed documented blood sugars and making Dr aware. RN will follow up outreach  [facility]   Patient will understand the signs and symptomsof hypo and hyperglycemia within the next 30   [facility] CM Short Term Goal #1 Start Date  04/28/17  Interventions for Short Term Goal #1  RN discussed the signs and symptoms of hyper and  hypoglycemia. RN will send a picture chart of signs and symptoms. RN will follow up with discussion and teach back  [facility] CM Short Term Goal #2   Patient be able to verbalize the action plan for hyper and hypoglycemia within the next 30 days  [facility] CM Short Term Goal #2 Start Date  04/28/17  Interventions for Short Term Goal #2  RN discussed the action plan of hyper and hypoglycemia. RN sent patient a living well with diabetes booklet RN will follow up with discussion and tech back  [facility] CM Short Term Goal #3  Patient willbe able to verbalize healthy foods and snacks to eat  [facility] CM Short Term Goal #3 Start Date  04/28/17   Interventions for Short Tern Goal #3  RN sent patient a list of healthy snacks and a list of diabetic foods to order from a food chain. RN will follow up with further discussion and teach back  [facility] CM Short Term Goal #4  Patient will report checking blood sugars as per ordered and documenting within the next 30 days  [facility] CM Short Term Goal #4 Start Date  04/28/17  Interventions for Short Term Goal #4  RN sent a calendar book for documentation. RN discussed with patient about documenting blood sugars and questions he may have in the book. RN will follow up with discussion and teach back      Assessment:  Knowledge deficit of self management for diabetes Plan:  RN discussed hypo and hyperglycemic symptoms and action plan RN sent calendar book for documentation RN sent living well with diabetes booklet RN sent list of healthy snacks RN sent list of food choices for diabetics from food chains RN sent picture chart of hypo and hyperglycemia RN sent EMMI information on hypo and hyperglycemia Patient will start Diabetic Nutritional classes  Patient will follow up with eye exam on May 08, 2017 RN will follow up with patient with the month of October RN sent physician barriers letter RN sent care plan  and assessment  Todd Avila BSN RN [facility] Care [facility] [phone]

## 2017-04-29 ENCOUNTER — Encounter: Payer: Self-pay | Admitting: *Deleted

## 2017-04-30 ENCOUNTER — Ambulatory Visit: Payer: Self-pay | Admitting: *Deleted

## 2017-04-30 DIAGNOSIS — E1165 Type 2 diabetes mellitus with hyperglycemia: Secondary | ICD-10-CM | POA: Diagnosis not present

## 2017-04-30 DIAGNOSIS — Z794 Long term (current) use of insulin: Secondary | ICD-10-CM | POA: Diagnosis not present

## 2017-04-30 DIAGNOSIS — Z7901 Long term (current) use of anticoagulants: Secondary | ICD-10-CM | POA: Diagnosis not present

## 2017-04-30 DIAGNOSIS — I2699 Other pulmonary embolism without acute cor pulmonale: Secondary | ICD-10-CM | POA: Diagnosis not present

## 2017-04-30 DIAGNOSIS — I10 Essential (primary) hypertension: Secondary | ICD-10-CM | POA: Diagnosis not present

## 2017-05-05 ENCOUNTER — Encounter: Payer: PPO | Attending: Family Medicine | Admitting: Dietician

## 2017-05-05 ENCOUNTER — Encounter: Payer: Self-pay | Admitting: Dietician

## 2017-05-05 DIAGNOSIS — Z794 Long term (current) use of insulin: Secondary | ICD-10-CM

## 2017-05-05 DIAGNOSIS — Z713 Dietary counseling and surveillance: Secondary | ICD-10-CM | POA: Diagnosis not present

## 2017-05-05 DIAGNOSIS — E119 Type 2 diabetes mellitus without complications: Secondary | ICD-10-CM | POA: Insufficient documentation

## 2017-05-05 NOTE — Patient Instructions (Signed)
Continue the changes that you have made!  Beverages without sugar.  Mindful of portion size of carbohydrates  Changing to whole grains  Stopping when you are full. Small portions salad dressing and other fats. When approved by your MD, add more activity to your day.   Aim for at 30 minutes most days of the week. Continue to take your medication as prescribed. Continue to check your blood sugar as recommended.  Aim for 4 Carb Choices per meal (60 grams) +/- 1 either way  Aim for 0-1 Carbs per snack if hungry  Include protein in moderation with your meals and snacks Consider reading food labels for Total Carbohydrate and Fat Grams of foods  Calorie Brooke Dare[employer] app

## 2017-05-05 NOTE — Progress Notes (Signed)
Diabetes Self-Management Education  Visit Type: First/Initial  Appt. Start Time: 0945 Appt. End Time: 1145  05/05/2017  [name], identified by name and date of birth, is a 66 y.o. male with a diagnosis of Diabetes: Type 2 03/28/17 with a HgbA1C of 14.5%.  He then developed a pulmonary embolism.  Other hx includes HTN.  Weight generally 165-175 lbs.  Dropped to 159 lbs prior to diagnosis. Medications include Lantus 15 units bid.  Short acting insulin has been stopped.    Patient moved in with a male friend after his diabetes diagnosis. They share shopping and cooking.  He has made many changes.  [place].  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  He is very organized and brings a spread sheet of his blood sugar readings.  His blood sugars are now most often WNL before meals.  He is generally not testing after meals.  ASSESSMENT  Height 5\' 7"  (1.702 m), weight 176 lb (79.8 kg). Body mass index is 27.57 kg/m.      Diabetes Self-Management Education - 05/05/17 1128      Visit Information   Visit Type First/Initial      Individualized Plan for Diabetes Self-Management Training:   Learning Objective:  Patient will have a greater understanding of diabetes self-management. Patient education plan is to attend individual and/or group sessions per assessed needs and concerns.   Plan:   Patient Instructions  Continue the changes that you have made!  Beverages without sugar.  Mindful of portion size of carbohydrates  Changing to whole grains  Stopping when you are full. Small portions salad dressing and other fats. When approved by your MD, add more activity to your day.   Aim for at 30 minutes most days of the week. Continue to take your medication as prescribed. Continue to check your blood sugar as recommended.  Aim for 4 Carb Choices per meal (60 grams) +/- 1 either way  Aim for 0-1 Carbs per snack if hungry  Include protein in moderation with your  meals and snacks Consider reading food labels for Total Carbohydrate and Fat Grams of foods  Calorie [employer] app   Expected Outcomes:  Demonstrated interest in learning. Expect positive outcomes  Education material provided: Living Well with Diabetes, Food label handouts, A1C conversion sheet, Meal plan card, My Plate and Snack sheet, Fats, Seasoning tips, medication list.  If problems or questions, patient to contact team via:  Phone  Future DSME appointment: PRN

## 2017-05-08 DIAGNOSIS — H2513 Age-related nuclear cataract, bilateral: Secondary | ICD-10-CM | POA: Diagnosis not present

## 2017-05-08 DIAGNOSIS — H40013 Open angle with borderline findings, low risk, bilateral: Secondary | ICD-10-CM | POA: Diagnosis not present

## 2017-05-08 DIAGNOSIS — H25013 Cortical age-related cataract, bilateral: Secondary | ICD-10-CM | POA: Diagnosis not present

## 2017-05-08 DIAGNOSIS — H353132 Nonexudative age-related macular degeneration, bilateral, intermediate dry stage: Secondary | ICD-10-CM | POA: Diagnosis not present

## 2017-05-12 ENCOUNTER — Ambulatory Visit: Payer: PPO

## 2017-05-19 ENCOUNTER — Ambulatory Visit: Payer: PPO

## 2017-05-28 ENCOUNTER — Ambulatory Visit (INDEPENDENT_AMBULATORY_CARE_PROVIDER_SITE_OTHER): Payer: PPO | Admitting: Endocrinology

## 2017-05-28 ENCOUNTER — Encounter: Payer: Self-pay | Admitting: Endocrinology

## 2017-05-28 VITALS — BP 134/88 | HR 74 | Ht 66.25 in | Wt 178.6 lb

## 2017-05-28 DIAGNOSIS — N210 Calculus in bladder: Secondary | ICD-10-CM | POA: Diagnosis not present

## 2017-05-28 DIAGNOSIS — Z794 Long term (current) use of insulin: Secondary | ICD-10-CM | POA: Diagnosis not present

## 2017-05-28 DIAGNOSIS — E1165 Type 2 diabetes mellitus with hyperglycemia: Secondary | ICD-10-CM | POA: Diagnosis not present

## 2017-05-28 DIAGNOSIS — E119 Type 2 diabetes mellitus without complications: Secondary | ICD-10-CM | POA: Diagnosis not present

## 2017-05-28 DIAGNOSIS — I10 Essential (primary) hypertension: Secondary | ICD-10-CM

## 2017-05-28 MED ORDER — METFORMIN HCL ER 500 MG PO TB24: 2000.0000 mg | ORAL_TABLET | Freq: Every day | ORAL | 3 refills | Status: DC

## 2017-05-28 NOTE — Progress Notes (Signed)
Patient ID: Todd Avila, male   DOB: 1950/09/26, 66 y.o.   MRN: [id number]          Reason for Appointment: Consultation for Type 2 Diabetes  Referring physician:   History of Present Illness:          Date of diagnosis of type 2 diabetes mellitus:        Background history:   Patient had gained weight last year after quitting smoking and was up to 196 pounds. Subsequently he started improving his diet and exercising at the gym and was able to come down 282 pounds in June After that he was having increased frequency of urination but he thought this was related to his bladder stone, also has having dry mouth and blurred vision and continued to lose weight He was admitted to the hospital on 8/4 with a blood sugar of 1492 without acidosis and minimal ketonuria. He was discharged on Lantus and NovoLog  Recent history:   INSULIN regimen is:      Lantus 15 units twice a day  Non-insulin hypoglycemic drugs the patient is taking are: None  Current management, blood sugar patterns and problems identified:  At the end of August she was seen by his PCP and since his blood sugars were looking excellent he was told not to take any Novolog but continue Lantus  He has seen a dietitian about 3 weeks ago and has started making changes in his diet  Overall he feels much better with his energy level and has no fatigue, blurred vision or increased urination  He is checking his sugars before meals and these are generally below 100, lowest reading 68 but he does not feel any shakiness or weakness when blood sugars are low normal  He does not take his readings after meals  He has tried to improve his diet and eat at structured times            Compliance with the medical regimen: Excellent  Glucose monitoring:  done 3 times a day         Glucometer: One Touch ultra 2 .      Blood Glucose readings by time of day and averages home record   PREMEAL Breakfast Lunch Dinner Bedtime  Overall     Glucose range: 77-97  72-124    Median:        Self-care: The diet that the patient has been following is: tries to limit [employer] Rx, high sugar foods and drinks .      Typical meal intake: Breakfast is eggs, grits, cereal Snacks: Chips, pretzels, nuts                Dietician visit, most recent: 05/05/17               Exercise: minimal  Weight history: max 196  Wt Readings from Last 3 Encounters:  05/28/17 178 lb 9.6 oz (81 kg)  05/05/17 176 lb (79.8 kg)  04/10/17 168 lb 9.6 oz (76.5 kg)    Glycemic control:   Lab Results  Component Value Date   HGBA1C 14.5 (H) 03/28/2017   Lab Results  Component Value Date   LDLCALC 131 (H) 03/28/2017   CREATININE 0.91 04/10/2017   No results found for: MICRALBCREAT  No results found for: FRUCTOSAMINE    Allergies as of 05/28/2017      Reactions   Amlodipine Other (See Comments)   "Feet Swelling"   Penicillins Other (See Comments)   unknown  Medication List       Accurate as of 05/28/17  2:56 PM. Always use your most recent med list.          apixaban 5 MG Tabs tablet Commonly known as:  ELIQUIS Take 2 tablets twice daily for 7 days, then take 1 tablet twice daily.   BD PEN NEEDLE NANO U/F 32G X 4 MM Misc Generic drug:  Insulin Pen Needle See admin instructions.   erythromycin ophthalmic ointment APPLY 1 CM RIBBON ON THE AFFECTED EYE TWICE A DAY FOR 5 DAYS   Insulin Glargine 100 UNIT/ML Solostar Pen Commonly known as:  LANTUS SOLOSTAR Inject 15 Units into the skin 2 (two) times daily at 8 am and 10 pm.   losartan 25 MG tablet Commonly known as:  COZAAR Take 12.5 mg by mouth daily.   metFORMIN 500 MG 24 hr tablet Commonly known as:  GLUCOPHAGE-XR Take 4 tablets (2,000 mg total) by mouth daily with supper.   ONE TOUCH ULTRA TEST test strip Generic drug:  glucose blood USE TO CHECK BLOOD SUGAR READING 3 TIMES DAILY, ALTERNATING AM'S AND PM'S BEFORE MEALS   ONETOUCH DELICA LANCETS FINE Misc USE TO  OBTAIN BLOOD SPECIMEN 3 TIMES DAILY, ALTERNATING AM'S AND PM'S BEFORE MEALS       Allergies:  Allergies  Allergen Reactions  . Amlodipine Other (See Comments)    "Feet Swelling"  . Penicillins Other (See Comments)    unknown    Past Medical History:  Diagnosis Date  . Acute pulmonary embolism (HCC) 04/08/2017  . Bladder stones   . DKA (diabetic ketoacidoses) (HCC) 03/28/2017   HHS/DKA; hospitalized for 3 days/notes 04/08/2017  . DVT (deep venous thrombosis) (HCC)    Unsure if this is superficial thrombophlebitis, happened in ~2010 in [place]  . HTN (hypertension)   . Pneumonia 2011  . Type II diabetes mellitus (HCC)    "dx'd 03/28/2017"    Past Surgical History:  Procedure Laterality Date  . COLONOSCOPY    . TONSILLECTOMY  1960    Family History  Problem Relation Age of Onset  . Heart failure Father   . Diabetes Sister     Social History:  reports that he quit smoking about 3 years ago. His smoking use included Cigarettes. He has a 4.80 pack-year smoking history. He has never used smokeless tobacco. He reports that he drinks alcohol. He reports that he does not use drugs.   Review of Systems  Constitutional: Negative for reduced appetite and malaise.  Eyes: Negative for blurred vision.  Respiratory: Negative for shortness of breath.   Cardiovascular: Negative for chest pain.  Gastrointestinal: Negative for diarrhea and abdominal pain.  Endocrine: Negative for fatigue and erectile dysfunction.  Genitourinary: Positive for frequency.  Musculoskeletal: Negative for joint pain.  Skin:       Patient asking about a depigmented area where an EKG sensor was placed in the hospital, otherwise no rashes  Neurological: Negative for numbness and tingling.  Psychiatric/Behavioral: Negative for insomnia.     Lipid history: He has never been on statin drugs    Lab Results  Component Value Date   CHOL 204 (H) 03/28/2017   HDL 44 03/28/2017   LDLCALC 131 (H) 03/28/2017   TRIG  145 03/28/2017   CHOLHDL 4.6 03/28/2017           Hypertension:Treated with losartan and low dose for about 1 year  Most recent eye exam was 9/18  Most recent foot exam:10/18    Physical  Examination:  BP 134/88   Pulse 74   Ht 5' 6.25" (1.683 m)   Wt 178 lb 9.6 oz (81 kg)   SpO2 98%   BMI 28.61 kg/m   GENERAL:        averagely built and nourished  HEENT:         Eye exam shows normal external appearance. Fundus exam deferred to ophthalmologist, recently done Oral exam shows normal mucosa .  NECK:   There is no lymphadenopathy Thyroid is not enlarged and no nodules felt.  Carotids are normal to palpation and no bruit heard LUNGS:         Chest is symmetrical. Lungs are clear to auscultation.Marland Kitchen   HEART:         Heart sounds:  S1 and S2 are normal. No murmur or click heard., no S3 or S4.   ABDOMEN:   There is no distention present. Liver and spleen are not palpable. No other mass or tenderness present.   NEUROLOGICAL:   Ankle jerks are 1+ bilaterally.    Diabetic Foot Exam - Simple   Simple Foot Form Diabetic Foot exam was performed with the following findings:  Yes   Visual Inspection No deformities, no ulcerations, no other skin breakdown bilaterally:  Yes Sensation Testing Intact to touch and monofilament testing bilaterally:  Yes Pulse Check Posterior Tibialis and Dorsalis pulse intact bilaterally:  Yes Comments            Vibration sense is Moderately reduced in distal first toes.  MUSCULOSKELETAL:  There is no swelling or deformity of the peripheral joints. Spine is normal to inspection.   EXTREMITIES:     There is no edema. No skin lesions present.Marland Kitchen SKIN:       No rash or lesions of concern.        ASSESSMENT:  Diabetes type 2, nonobese with severe hypoglycemia and nonketotic hyperosmolar state at diagnosis recently    See history of present illness for detailed discussion of current diabetes management, blood sugar patterns and problems identified  The  patient has been on basal insulin recently with fairly normal blood sugars pre-meal which he is monitoring 3 times a day Has had nutritional counseling and has been improving his diet significantly Has been recently fairly good with his compliance with all aspects of self-care although has not been able to start exercise yet because of recovering from pulmonary embolism  Most likely he has non-insulin-dependent diabetes but had significant glucose toxicity causing severe hyperglycemia at onset  Complications of diabetes: None evident, has decreased vibration sense of unclear significance  Mild hypertension, well-controlled  Mild hypercholesterolemia  History of bladder stone  PLAN:     Patient can be controlled with non-insulin agents now since he does not have any effects of glucose toxicity  He was explained about the role of metformin in diabetes control and actions as well as dosage titration and possible side effects especially GI.  Patient handout on this given  He will start with 500 mg daily and go up every 5 days until he is up to 2000 mg or maximally tolerated dosage  Patient was also given a flowsheet to start titrating down his LANTUS insulin by 2 units at bedtime  He can go down to 12 units twice a day from tomorrow for 3 days and continue to reduce the dose unless blood sugars are starting to go over 110 fasting  He was started on regular walking program  Continue monitoring blood sugar  but he can cut back on frequency and started doing a few readings 2-3 hours after meals also  Follow-up in 3 weeks to reassess his progress  Also if he is starting to get hyperglycemia after meals may consider adding Januvia or Invokana  Needs follow-up lipids in about 3 months  Patient Instructions  Start taking Metformin 500 mg, 1 tablet with your main meal for 5 days.  Occasionally this may initially cause loose stools or nausea.  If  tolerating well after 5 days add a second  Metformin tablet (500 mg) at the same time. Continue adding another tablet after 5 days days if no persistent nausea or diarrhea until reaching the maximum tolerated dose or the full dose of 4 tablets once daily.  Check blood sugars on waking up  Daily for now  Also check blood sugars about 2 hours after a meal and do this after different meals by rotation  Recommended blood sugar levels on waking up is 80-120 and about 2 hours after meal is 130-160  Please bring your blood sugar monitor to each visit, thank you     Consultation note has been sent to the referring physician  [facility] 05/28/2017, 2:56 PM   Note: This office note was prepared with Dragon voice recognition system technology. Any transcriptional errors that result from this process are unintentional.

## 2017-05-28 NOTE — Patient Instructions (Addendum)
Start taking Metformin 500 mg, 1 tablet with your main meal for 5 days.  Occasionally this may initially cause loose stools or nausea.  If  tolerating well after 5 days add a second Metformin tablet (500 mg) at the same time. Continue adding another tablet after 5 days days if no persistent nausea or diarrhea until reaching the maximum tolerated dose or the full dose of 4 tablets once daily.  Check blood sugars on waking up  Daily for now  Also check blood sugars about 2 hours after a meal and do this after different meals by rotation  Recommended blood sugar levels on waking up is 80-120 and about 2 hours after meal is 130-160  Please bring your blood sugar monitor to each visit, thank you

## 2017-05-29 ENCOUNTER — Ambulatory Visit: Payer: Self-pay | Admitting: *Deleted

## 2017-06-03 ENCOUNTER — Other Ambulatory Visit: Payer: Self-pay

## 2017-06-03 ENCOUNTER — Telehealth: Payer: Self-pay | Admitting: Endocrinology

## 2017-06-03 MED ORDER — FREESTYLE LIBRE SENSOR SYSTEM MISC: 3 refills | Status: DC

## 2017-06-03 MED ORDER — FREESTYLE LIBRE READER DEVI: 1.0000 | Freq: Every day | 1 refills | Status: DC

## 2017-06-03 NOTE — Telephone Encounter (Signed)
Please advise if okay to send in a prescription.

## 2017-06-03 NOTE — Telephone Encounter (Signed)
Called patient and left a voice message to let him know that I have sent this prescription over to the [facility] on [address] for his Freestyle Libre [employer] and [employer]. He can call us back and set up an appointment for the [place] training if he needs to.

## 2017-06-03 NOTE — Telephone Encounter (Signed)
Okay to send 

## 2017-06-03 NOTE — Telephone Encounter (Signed)
Patient  Would like a prescription for free style libre sent to [facility] pharmacy  [facility] [address]

## 2017-06-26 ENCOUNTER — Other Ambulatory Visit (INDEPENDENT_AMBULATORY_CARE_PROVIDER_SITE_OTHER): Payer: PPO

## 2017-06-26 DIAGNOSIS — E119 Type 2 diabetes mellitus without complications: Secondary | ICD-10-CM | POA: Diagnosis not present

## 2017-06-26 LAB — BASIC METABOLIC PANEL
BUN: 15 mg/dL (ref 6–23)
CO2: 28 mEq/L (ref 19–32)
Calcium: 9.8 mg/dL (ref 8.4–10.5)
Chloride: 103 mEq/L (ref 96–112)
Creatinine, Ser: 0.88 mg/dL (ref 0.40–1.50)
GFR: 111.35 mL/min (ref >60.00)
Glucose, Bld: 112 mg/dL — ABNORMAL HIGH (ref 70–99)
Potassium: 4.1 mEq/L (ref 3.5–5.1)
Sodium: 138 mEq/L (ref 135–145)

## 2017-06-26 LAB — MICROALBUMIN / CREATININE URINE RATIO
Creatinine,U: 138.2 mg/dL
Microalb Creat Ratio: 66.3 mg/g — ABNORMAL HIGH (ref 0.0–30.0)
Microalb, Ur: 91.6 mg/dL — ABNORMAL HIGH (ref 0.0–1.9)

## 2017-06-27 LAB — FRUCTOSAMINE: Fructosamine: 225 umol/L (ref 0–285)

## 2017-06-29 ENCOUNTER — Ambulatory Visit (INDEPENDENT_AMBULATORY_CARE_PROVIDER_SITE_OTHER): Payer: PPO | Admitting: Endocrinology

## 2017-06-29 ENCOUNTER — Encounter: Payer: Self-pay | Admitting: Endocrinology

## 2017-06-29 VITALS — BP 132/80 | HR 95 | Ht 66.25 in | Wt 178.2 lb

## 2017-06-29 DIAGNOSIS — E119 Type 2 diabetes mellitus without complications: Secondary | ICD-10-CM

## 2017-06-29 NOTE — Patient Instructions (Signed)
Check blood sugars on waking up  2/7  Also check blood sugars about 2 hours after a meal and do this after different meals by rotation  Recommended blood sugar levels on waking up is 90-130 and about 2 hours after meal is 130-160  Please bring your blood sugar monitor to each visit, thank you  

## 2017-06-29 NOTE — Progress Notes (Addendum)
Patient ID: Todd Avila, male   DOB: 20-Oct-1950, 66 y.o.   MRN: [id number]          Reason for Appointment:  Follow-up for Type 2 Diabetes  Referring physician: [name]   History of Present Illness:          Date of diagnosis of type 2 diabetes mellitus: 8/18       Background history:   Patient had gained weight last year after quitting smoking and was up to 196 pounds. Subsequently he started improving his diet and exercising at the gym and was able to come down 282 pounds in June After that he was having increased frequency of urination but he thought this was related to his bladder stone, also has having dry mouth and blurred vision and continued to lose weight He was admitted to the hospital on 8/4 with a blood sugar of 1492 without acidosis and minimal ketonuria. He was discharged on Lantus and NovoLog  Recent history:   INSULIN regimen is:  none since 06/11/17  Non-insulin hypoglycemic drugs the patient is taking are: Metformin ER 2000 mg daily  Current management, blood sugar patterns and problems identified:  When he was first seen in consultation for his diabetes his blood sugars were near normal before meals on Lantus 15 units twice a day  He was switched to metformin ER and his Lantus was tapered off about 2 weeks later as instructed  He personally wanted to use a continuous glucose monitor and is using the [place] instead of doing fingersticks  However this appears to be getting him falsely low readings with blood sugars as low as 57 last Saturday and average blood sugar of 65 fasting; when he had a 57 blood sugar he was asymptomatic and his fingerstick glucose was 86 on his One Touch meter  Also according to the sensor his blood sugars are below 70 about 30% of the time; he is however checking blood sugars mostly in the morning and sometimes after breakfast and rarely in the evening  HIGHEST blood sugar on the sensor was 132 after eating pizza  However he  is generally trying to improve his diet and avoid high-fat foods  His adding some protein to his breakfast with eggs  Although he is not doing any significant formal exercise he thinks he is trying to walk a little more than usual  No side effects from metformin which he is not taking 4 tablets daily          Compliance with the medical regimen: Excellent  Glucose monitoring:          Glucometer:  previously One Touch ultra 2 currently freestyle [place] .      Blood Glucose readings by time of day and averages home record  Overall average blood sugar 85 on his freestyle Libre sensor with blood sugar data as above  Self-care: The diet that the patient has been following is: tries to limit [employer] Rx, high sugar foods and drinks .      Typical meal intake: Breakfast is eggs, grits, cereal Snacks: Chips, pretzels, nuts                Dietician visit, most recent: 05/05/17               Exercise: some walking  Weight history: max 196  Wt Readings from Last 3 Encounters:  06/29/17 178 lb 3.2 oz (80.8 kg)  05/28/17 178 lb 9.6 oz (81 kg)  05/05/17 176  lb (79.8 kg)    Glycemic control:   Lab Results  Component Value Date   HGBA1C 14.5 (H) 03/28/2017   Lab Results  Component Value Date   MICROALBUR 91.6 (H) 06/26/2017   LDLCALC 131 (H) 03/28/2017   CREATININE 0.88 06/26/2017   Lab Results  Component Value Date   MICRALBCREAT 66.3 (H) 06/26/2017    Lab Results  Component Value Date   FRUCTOSAMINE 225 06/26/2017      Allergies as of 06/29/2017      Reactions   Amlodipine Other (See Comments)   "Feet Swelling"   Penicillins Other (See Comments)   unknown      Medication List        Accurate as of 06/29/17  3:16 PM. Always use your most recent med list.          apixaban 5 MG Tabs tablet Commonly known as:  ELIQUIS Take 2 tablets twice daily for 7 days, then take 1 tablet twice daily.   BD PEN NEEDLE NANO U/F 32G X 4 MM Misc Generic drug:  Insulin Pen  Needle See admin instructions.   FREESTYLE LIBRE READER Devi 1 Device by Does not apply route daily.   FREESTYLE LIBRE SENSOR SYSTEM Misc Use one sensor for 10 days.   Insulin Glargine 100 UNIT/ML Solostar Pen Commonly known as:  LANTUS SOLOSTAR Inject 15 Units into the skin 2 (two) times daily at 8 am and 10 pm.   losartan 25 MG tablet Commonly known as:  COZAAR Take 12.5 mg by mouth daily.   metFORMIN 500 MG 24 hr tablet Commonly known as:  GLUCOPHAGE-XR Take 4 tablets (2,000 mg total) by mouth daily with supper.   ONE TOUCH ULTRA TEST test strip Generic drug:  glucose blood USE TO CHECK BLOOD SUGAR READING 3 TIMES DAILY, ALTERNATING AM'S AND PM'S BEFORE MEALS   ONETOUCH DELICA LANCETS FINE Misc USE TO OBTAIN BLOOD SPECIMEN 3 TIMES DAILY, ALTERNATING AM'S AND PM'S BEFORE MEALS       Allergies:  Allergies  Allergen Reactions  . Amlodipine Other (See Comments)    "Feet Swelling"  . Penicillins Other (See Comments)    unknown    Past Medical History:  Diagnosis Date  . Acute pulmonary embolism (HCC) 04/08/2017  . Bladder stones   . DKA (diabetic ketoacidoses) (HCC) 03/28/2017   HHS/DKA; hospitalized for 3 days/notes 04/08/2017  . DVT (deep venous thrombosis) (HCC)    Unsure if this is superficial thrombophlebitis, happened in ~2010 in [place]  . HTN (hypertension)   . Pneumonia 2011  . Type II diabetes mellitus (HCC)    "dx'd 03/28/2017"    Past Surgical History:  Procedure Laterality Date  . COLONOSCOPY    . TONSILLECTOMY  1960    Family History  Problem Relation Age of Onset  . Heart failure Father   . Diabetes Sister     Social History:  reports that he quit smoking about 3 years ago. His smoking use included cigarettes. He has a 4.80 pack-year smoking history. he has never used smokeless tobacco. He reports that he drinks alcohol. He reports that he does not use drugs.   Review of Systems   Lipid history: He has never been on statin drugs    Lab  Results  Component Value Date   CHOL 204 (H) 03/28/2017   HDL 44 03/28/2017   LDLCALC 131 (H) 03/28/2017   TRIG 145 03/28/2017   CHOLHDL 4.6 03/28/2017  Hypertension:Treated with losartan and low dose for about 1 year  Most recent eye exam was 9/18  Most recent foot exam:10/18   Physical Examination:  BP 132/80   Pulse 95   Ht 5' 6.25" (1.683 m)   Wt 178 lb 3.2 oz (80.8 kg)   SpO2 98%   BMI 28.55 kg/m      ASSESSMENT:  Diabetes type 2, nonobese with severe hyperglycemia and nonketotic hyperosmolar state at diagnosis   See history of present illness for description of current diabetes management, blood sugar patterns and problems identified  The patient has been on only metformin now for the last 3 weeks or so with excellent control FRUCTOSAMINE is 225 indicating fairly good blood sugars  This is now with metformin only which he is tolerating well Discussed with the patient that his freestyle Libre sensor is reading falsely low most of the time and cannot be reliable at this time   PLAN:     Continue metformin 5 insulin resistance and hopefully to restore pancreatic function  Also discussed the non- glycemic benefits of metformin in patient with diabetes and encouraged him to stay on this for at least 3 months at the full dose  He will check his blood sugar with a fingerstick at least 5-7 days a week but not at the same time  He probably needs to focus more on postprandial readings  Encouraged him to walk regularly for exercise  Needs follow-up lipids     Patient Instructions  Check blood sugars on waking up  2/7  Also check blood sugars about 2 hours after a meal and do this after different meals by rotation  Recommended blood sugar levels on waking up is 90-130 and about 2 hours after meal is 130-160  Please bring your blood sugar monitor to each visit, thank you      [facility] CenterKUMAR,Amadi Yoshino 06/29/2017, 3:16 PM   Note: This office note was prepared  with Dragon voice recognition system technology. Any transcriptional errors that result from this process are unintentional.

## 2017-07-03 ENCOUNTER — Telehealth: Payer: Self-pay | Admitting: Endocrinology

## 2017-07-03 NOTE — Telephone Encounter (Signed)
Patient currently taking 4 Metformin ER at night (all at once), he would like to know if he can take 2 in the morning and 2 at night. Please advise patient.

## 2017-07-03 NOTE — Telephone Encounter (Signed)
Pt is aware of the note below. °

## 2017-07-03 NOTE — Telephone Encounter (Signed)
Please advise on the note below 

## 2017-07-03 NOTE — Telephone Encounter (Signed)
Yes

## 2017-07-12 DIAGNOSIS — J069 Acute upper respiratory infection, unspecified: Secondary | ICD-10-CM | POA: Diagnosis not present

## 2017-07-24 ENCOUNTER — Other Ambulatory Visit: Payer: Self-pay | Admitting: *Deleted

## 2017-07-24 DIAGNOSIS — N210 Calculus in bladder: Secondary | ICD-10-CM | POA: Diagnosis not present

## 2017-07-24 DIAGNOSIS — R300 Dysuria: Secondary | ICD-10-CM | POA: Diagnosis not present

## 2017-07-24 DIAGNOSIS — N401 Enlarged prostate with lower urinary tract symptoms: Secondary | ICD-10-CM | POA: Diagnosis not present

## 2017-07-24 DIAGNOSIS — R351 Nocturia: Secondary | ICD-10-CM | POA: Diagnosis not present

## 2017-07-24 NOTE — Patient Outreach (Signed)
[facility] Care Management  07/24/2017  Todd HaberStephan Avila 02/06/1951 [id number]   RN Health Coach attempted  #1 follow up outreach call to patient.  Patient was unavailable. HIPPA compliance voicemail message left with return callback number.  Plan: RN will call patient again within 14 days.  Gean MaidensFrances Alcee Sipos BSN RN [facility] Care Management [phone]

## 2017-07-30 ENCOUNTER — Other Ambulatory Visit: Payer: Self-pay | Admitting: *Deleted

## 2017-07-30 NOTE — Patient Outreach (Signed)
[facility] Care Management  07/30/2017  Todd HaberStephan Avila 05/13/1951 [id number]   RN Health Coach attempted #2 follow up outreach call to patient.  Patient was unavailable. HIPPA compliance voicemail message left with return callback number.  Plan: RN will call patient again within 14 days.  Gean MaidensFrances [name] BSN RN [facility] Care Management [phone]

## 2017-07-31 ENCOUNTER — Other Ambulatory Visit (INDEPENDENT_AMBULATORY_CARE_PROVIDER_SITE_OTHER): Payer: PPO

## 2017-07-31 DIAGNOSIS — E119 Type 2 diabetes mellitus without complications: Secondary | ICD-10-CM | POA: Diagnosis not present

## 2017-07-31 LAB — BASIC METABOLIC PANEL
BUN: 14 mg/dL (ref 6–23)
CO2: 29 mEq/L (ref 19–32)
Calcium: 9.4 mg/dL (ref 8.4–10.5)
Chloride: 103 mEq/L (ref 96–112)
Creatinine, Ser: 0.86 mg/dL (ref 0.40–1.50)
GFR: 114.31 mL/min (ref >60.00)
Glucose, Bld: 89 mg/dL (ref 70–99)
Potassium: 4.1 mEq/L (ref 3.5–5.1)
Sodium: 138 mEq/L (ref 135–145)

## 2017-07-31 LAB — LIPID PANEL
Cholesterol: 131 mg/dL (ref 0–200)
HDL: 42.9 mg/dL (ref >39.00)
LDL Cholesterol: 76 mg/dL (ref 0–99)
NonHDL: 87.68
Total CHOL/HDL Ratio: 3
Triglycerides: 58 mg/dL (ref 0.0–149.0)
VLDL: 11.6 mg/dL (ref 0.0–40.0)

## 2017-07-31 LAB — HEMOGLOBIN A1C: Hgb A1c MFr Bld: 5.3 % (ref 4.6–6.5)

## 2017-08-03 ENCOUNTER — Ambulatory Visit: Payer: PPO | Admitting: Endocrinology

## 2017-08-03 NOTE — Patient Outreach (Signed)
[facility] Care Management 07/30/2017  Sadie HaberStephan Demeter 01/22/1951 [id number]  Subjective: RN Health Coach received return telephone call from patient.  [employer] compliance verified. 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 Patient has been checking his blood sugars with meter against the free style libre for variations. Patient is taking medications as prescribed. Patient has went to the nutritionist classes. Patient is eating  the proper foods for his diet. Per patient he has a bladder stone and is pending surgery to remove. Patient is awaiting next A1C to see how far it has decreased from 14.5 on 03/30/2017.  Patient is checking his feet daily. Patient has agreed to follow up outreach and education.  Current Medications:  Current Outpatient Medications  Medication Sig Dispense Refill  . apixaban (ELIQUIS) 5 MG TABS tablet Take 2 tablets twice daily for 7 days, then take 1 tablet twice daily. 70 tablet 0  . BD PEN NEEDLE NANO U/F 32G X 4 MM MISC See admin instructions.  0  . Continuous Blood Gluc Receiver (FREESTYLE LIBRE READER) DEVI 1 Device by Does not apply route daily. 1 Device 1  . Continuous Blood Gluc Sensor (FREESTYLE LIBRE SENSOR SYSTEM) MISC Use one sensor for 10 days. 3 each 3  . losartan (COZAAR) 25 MG tablet Take 12.5 mg by mouth daily.     . metFORMIN (GLUCOPHAGE-XR) 500 MG 24 hr tablet Take 4 tablets (2,000 mg total) by mouth daily with supper. 120 tablet 3  . ONE TOUCH ULTRA TEST test strip USE TO CHECK BLOOD SUGAR READING 3 TIMES DAILY, ALTERNATING AM'S AND PM'S BEFORE MEALS  5  . ONETOUCH DELICA LANCETS FINE MISC USE TO OBTAIN BLOOD SPECIMEN 3 TIMES DAILY, ALTERNATING AM'S AND PM'S BEFORE MEALS  5   No current facility-administered medications for this visit.     Functional Status:  In your present state of health, do you have any difficulty performing the following activities: 04/28/2017 04/08/2017  Hearing? N N  Vision? N N  Difficulty concentrating or making  decisions? N N  Walking or climbing stairs? N N  Dressing or bathing? N N  Doing errands, shopping? N N  Preparing Food and eating ? N -  Using the Toilet? N -  In the past six months, have you accidently leaked urine? Y -  Comment Patient hasa bladder stone and has frequency -  Do you have problems with loss of bowel control? N -  Managing your Medications? N -  Managing your Finances? N -  Housekeeping or managing your Housekeeping? N -  Some recent data might be hidden    Fall/Depression Screening: Fall Risk  07/30/2017 05/05/2017 04/28/2017  Falls in the past year? No No No   PHQ 2/9 Scores 07/30/2017 05/05/2017 04/28/2017  PHQ - 2 Score 0 0 0    Assessment:  Patient is checking his blood sugars with the meter and free style libre for variations Patient is taking medications as ordered Patient did attend the nutrition classes Patient is trying to make appropriate food choices Patient will continue to benefit from Health Coach telephonic outreach for education and support for diabetes self management. Plan:  RN discussed hypo and hyperglycemic symptoms RN discussed healthy eating with teach back RN sent educational material on bladder stones Patient has future appointment for next A1C RN will follow up within the month of January  Gean MaidensFrances Harry Shuck BSN RN [facility] Care Management [phone]

## 2017-08-07 DIAGNOSIS — Z01818 Encounter for other preprocedural examination: Secondary | ICD-10-CM | POA: Diagnosis not present

## 2017-08-07 DIAGNOSIS — N210 Calculus in bladder: Secondary | ICD-10-CM | POA: Diagnosis not present

## 2017-08-07 DIAGNOSIS — Z79899 Other long term (current) drug therapy: Secondary | ICD-10-CM | POA: Diagnosis not present

## 2017-08-12 ENCOUNTER — Encounter: Payer: Self-pay | Admitting: Endocrinology

## 2017-08-12 ENCOUNTER — Ambulatory Visit (INDEPENDENT_AMBULATORY_CARE_PROVIDER_SITE_OTHER): Payer: PPO | Admitting: Endocrinology

## 2017-08-12 VITALS — BP 122/80 | HR 81 | Ht 66.25 in | Wt 176.4 lb

## 2017-08-12 DIAGNOSIS — E119 Type 2 diabetes mellitus without complications: Secondary | ICD-10-CM

## 2017-08-12 NOTE — Patient Instructions (Signed)
3 Metformin daily

## 2017-08-12 NOTE — Progress Notes (Signed)
Patient ID: Todd HaberStephan Avila, male   DOB: 05/09/1951, 66 y.o.   MRN: [id number]          Reason for Appointment:  Follow-up for Type 2 Diabetes  Referring physician: Mila PalmerSharon Avila   History of Present Illness:          Date of diagnosis of type 2 diabetes mellitus: 8/18       Background history:   Patient had gained weight last year after quitting smoking and was up to 196 pounds. Subsequently he started improving his diet and exercising at the gym and was able to come down 282 pounds in June After that he was having increased frequency of urination but he thought this was related to his bladder stone, also has having dry mouth and blurred vision and continued to lose weight He was admitted to the hospital on 8/4 with a blood sugar of 1492 without acidosis and minimal ketonuria. He was discharged on Lantus and NovoLog  Recent history:    Non-insulin hypoglycemic drugs the patient is taking are: Metformin ER 2000 mg daily  Current management, blood sugar patterns and problems identified:  His blood sugars are still fairly well controlled and in the normal range both fasting and after evening meal  He prefers to use the freestyle Sensor but he thinks it is reading 10-30 mg lower than the fingersticks which he is still doing  No side effects with 2000 mg of metformin ER which he is not having any difficulty taking or accepting as a treatment  Because of his overactive bladder and other issues he is still not deciding walking as discussed on the last 2 visits  HIGHEST blood sugar on the sensor was 131 and on the glucose monitor 128  He is still adding some protein to his breakfast with eggs  Also overall able to follow a healthy diet, does not think he wants to see the dietitian again  No side effects from metformin which he is taking 4 tablets daily          Compliance with the medical regimen: Excellent  Glucose monitoring:          Glucometer:  One Touch ultra 2 and  freestyle Libre .      Blood Glucose readings by current monitoring devices:  SENSOR average 82 AVERAGE by time of dayRanges between 73 before 6 AM and 124 after 10 PM  GLUCOSE meter: FASTING average about 89, EVENING average, 9-11 PM about 114 Overall median 91   Self-care: The diet that the patient has been following is: tries to limit [employer] food, high sugar foods and drinks .      Typical meal intake: Breakfast is eggs, grits, cereal Snacks: Chips, pretzels, nuts                Dietician visit, most recent: 05/05/17               Exercise:  recently no walking  Weight history: max 196  Wt Readings from Last 3 Encounters:  08/12/17 176 lb 6.4 oz (80 kg)  06/29/17 178 lb 3.2 oz (80.8 kg)  05/28/17 178 lb 9.6 oz (81 kg)    Glycemic control:   Lab Results  Component Value Date   HGBA1C 5.3 07/31/2017   HGBA1C 14.5 (H) 03/28/2017   Lab Results  Component Value Date   MICROALBUR 91.6 (H) 06/26/2017   LDLCALC 76 07/31/2017   CREATININE 0.86 07/31/2017   Lab Results  Component Value Date  MICRALBCREAT 66.3 (H) 06/26/2017    Lab Results  Component Value Date   FRUCTOSAMINE 225 06/26/2017      Allergies as of 08/12/2017      Reactions   Amlodipine Other (See Comments)   "Feet Swelling"   Penicillins Other (See Comments)   unknown      Medication List        Accurate as of 08/12/17  4:55 PM. Always use your most recent med list.          apixaban 5 MG Tabs tablet Commonly known as:  ELIQUIS Take 2 tablets twice daily for 7 days, then take 1 tablet twice daily.   FREESTYLE LIBRE READER Devi 1 Device by Does not apply route daily.   FREESTYLE LIBRE SENSOR SYSTEM Misc Use one sensor for 10 days.   losartan 25 MG tablet Commonly known as:  COZAAR Take 12.5 mg by mouth daily.   metFORMIN 500 MG 24 hr tablet Commonly known as:  GLUCOPHAGE-XR Take 4 tablets (2,000 mg total) by mouth daily with supper.   ONE TOUCH ULTRA TEST test strip Generic  drug:  glucose blood USE TO CHECK BLOOD SUGAR READING 3 TIMES DAILY, ALTERNATING AM'S AND PM'S BEFORE MEALS   ONETOUCH DELICA LANCETS FINE Misc USE TO OBTAIN BLOOD SPECIMEN 3 TIMES DAILY, ALTERNATING AM'S AND PM'S BEFORE MEALS       Allergies:  Allergies  Allergen Reactions  . Amlodipine Other (See Comments)    "Feet Swelling"  . Penicillins Other (See Comments)    unknown    Past Medical History:  Diagnosis Date  . Acute pulmonary embolism (HCC) 04/08/2017  . Bladder stones   . DKA (diabetic ketoacidoses) (HCC) 03/28/2017   HHS/DKA; hospitalized for 3 days/notes 04/08/2017  . DVT (deep venous thrombosis) (HCC)    Unsure if this is superficial thrombophlebitis, [place]  . HTN (hypertension)   . Pneumonia 2011  . Type II diabetes mellitus (HCC)    "dx'd 03/28/2017"    Past Surgical History:  Procedure Laterality Date  . COLONOSCOPY    . TONSILLECTOMY  1960    Family History  Problem Relation Age of Onset  . Heart failure Father   . Diabetes Sister     Social History:  reports that he quit smoking about 3 years ago. His smoking use included cigarettes. He has a 4.80 pack-year smoking history. he has never used smokeless tobacco. He reports that he drinks alcohol. He reports that he does not use drugs.   Review of Systems   Lipid history: He has never been on statin drugs, LDL is near 70    Lab Results  Component Value Date   CHOL 131 07/31/2017   HDL 42.90 07/31/2017   LDLCALC 76 07/31/2017   TRIG 58.0 07/31/2017   CHOLHDL 3 07/31/2017           Hypertension:Treated with losartan and low dose for about 1 year followed by PCP  Most recent eye exam was 9/18  Most recent foot exam:10/18   Physical Examination:  BP 122/80   Pulse 81   Ht 5' 6.25" (1.683 m)   Wt 176 lb 6.4 oz (80 kg)   SpO2 98%   BMI 28.26 kg/m      ASSESSMENT:  Diabetes type 2, nonobese with severe hypoglycemia and nonketotic hyperosmolar state at diagnosis    See history of present illness for description of current diabetes management, blood sugar patterns and problems identified  Although he had  required insulin at diagnosis he is now on metformin only, 2000 mg  A1c is in the normal range at 5.3 and home blood sugars are also in the normal range both fasting and after evening meal He is doing well with his diet although can do better with exercise   Again his freestyle Libre sensor is reading falsely low most of the time but he prefers to use this for convenience and use the One Touch meter for backup  Microalbuminuria: This may resolve with better diabetes control and will follow  PLAN:     Since his blood sugars are quite normal he can at least try to reduce his metformin ER down to 1500 mg  Discussed importance for treating insulin resistance for future benefits also  Start exercise when able to  He cannot today fasting and postprandial readings and can also do the freestyle [place] monitoring intermittently needed  Discussed blood sugar targets at various times  Follow-up in 3 months   There are no Patient Instructions on file for this visit.    Reather Littler 08/12/2017, 4:55 PM   Note: This office note was prepared with Dragon voice recognition system technology. Any transcriptional errors that result from this process are unintentional.

## 2017-08-13 ENCOUNTER — Ambulatory Visit: Payer: Self-pay | Admitting: *Deleted

## 2017-08-26 DIAGNOSIS — R351 Nocturia: Secondary | ICD-10-CM | POA: Diagnosis not present

## 2017-08-26 DIAGNOSIS — N401 Enlarged prostate with lower urinary tract symptoms: Secondary | ICD-10-CM | POA: Diagnosis not present

## 2017-08-26 DIAGNOSIS — N210 Calculus in bladder: Secondary | ICD-10-CM | POA: Diagnosis not present

## 2017-08-27 DIAGNOSIS — H40013 Open angle with borderline findings, low risk, bilateral: Secondary | ICD-10-CM | POA: Diagnosis not present

## 2017-08-27 DIAGNOSIS — H04123 Dry eye syndrome of bilateral lacrimal glands: Secondary | ICD-10-CM | POA: Diagnosis not present

## 2017-08-27 DIAGNOSIS — H01009 Unspecified blepharitis unspecified eye, unspecified eyelid: Secondary | ICD-10-CM | POA: Diagnosis not present

## 2017-08-27 DIAGNOSIS — H00015 Hordeolum externum left lower eyelid: Secondary | ICD-10-CM | POA: Diagnosis not present

## 2017-08-28 ENCOUNTER — Other Ambulatory Visit: Payer: Self-pay | Admitting: Urology

## 2017-08-31 ENCOUNTER — Encounter (HOSPITAL_COMMUNITY): Payer: Self-pay

## 2017-08-31 NOTE — Pre-Procedure Instructions (Signed)
The following are in epic: Last office note Dr. Lucianne MussKumar 08/12/17 Hgb A1C (5.3) 07/31/17 EKG 04/08/17 ECHO 04/09/17 Vascular ultrasound, CT chest, CXR 04/08/17 U/S renal 03/29/17

## 2017-08-31 NOTE — Patient Instructions (Signed)
Your procedure is scheduled on: Monday, Jan. 14, 2019   Surgery Time:  7:30AM-9:30AM   [facility]   Follow map to Short Stay on first floor at 5:30 AM   Call this number if you have problems the morning of surgery [phone]   Do not eat food or drink liquids :After Midnight.   Do NOT smoke after Midnight   Take these medicines the morning of surgery with A SIP OF WATER: None   Do not take evening dose of Metformin the night before surgery   DO NOT TAKE ANY DIABETIC MEDICATIONS DAY OF YOUR SURGERY                               You may not have any metal on your body including jewelry, and body piercings             Do not wear lotions, powders, perfumes/cologne, or deodorant                          Men may shave face and neck.   Do not bring valuables to the hospital. [facility] IS NOT             RESPONSIBLE   FOR VALUABLES.   Contacts, dentures or bridgework may not be worn into surgery.   Patients discharged the day of surgery will not be allowed to drive home.   Name and phone number of your driver:   Special Instructions: Bring a copy of your healthcare power of attorney and living will documents         the day of surgery if you haven't scanned them in before.              Please read over the following fact sheets you were given:   [facility] - Preparing for Surgery Before surgery, you can play an important role.  Because skin is not sterile, your skin needs to be as free of germs as possible.  You can reduce the number of germs on your skin by washing with CHG (chlorahexidine gluconate) soap before surgery.  CHG is an antiseptic cleaner which kills germs and bonds with the skin to continue killing germs even after washing. Please DO NOT use if you have an allergy to CHG or antibacterial soaps.  If your skin becomes reddened/irritated stop using the CHG and inform your nurse when you arrive at Short Stay. Do not shave  (including legs and underarms) for at least 48 hours prior to the first CHG shower.  You may shave your face/neck.  Please follow these instructions carefully:  1.  Shower with CHG Soap the night before surgery and the  morning of surgery.  2.  If you choose to wash your hair, wash your hair first as usual with your normal  shampoo.  3.  After you shampoo, rinse your hair and body thoroughly to remove the shampoo.                             4.  Use CHG as you would any other liquid soap.  You can apply chg directly to the skin and wash.  Gently with a scrungie or clean washcloth.  5.  Apply the CHG Soap to your body ONLY FROM THE NECK DOWN.  Do   not use on face/ open                           Wound or open sores. Avoid contact with eyes, ears mouth and   genitals (private parts).                       Wash face,  Genitals (private parts) with your normal soap.             6.  Wash thoroughly, paying special attention to the area where your    surgery  will be performed.  7.  Thoroughly rinse your body with warm water from the neck down.  8.  DO NOT shower/wash with your normal soap after using and rinsing off the CHG Soap.                9.  Pat yourself dry with a clean towel.            10.  Wear clean pajamas.            11.  Place clean sheets on your bed the night of your first shower and do not  sleep with pets. Day of Surgery : Do not apply any lotions/deodorants the morning of surgery.  Please wear clean clothes to the hospital/surgery center.  FAILURE TO FOLLOW THESE INSTRUCTIONS MAY RESULT IN THE CANCELLATION OF YOUR SURGERY  PATIENT SIGNATURE_________________________________  NURSE SIGNATURE__________________________________  ________________________________________________________________________

## 2017-09-01 ENCOUNTER — Ambulatory Visit: Payer: Self-pay | Admitting: *Deleted

## 2017-09-02 ENCOUNTER — Encounter (HOSPITAL_COMMUNITY)
Admission: RE | Admit: 2017-09-02 | Discharge: 2017-09-02 | Disposition: A | Discharge status: Home / Self Care | Payer: PPO | Source: Ambulatory Visit | Attending: Urology | Admitting: Urology

## 2017-09-02 ENCOUNTER — Other Ambulatory Visit: Payer: Self-pay

## 2017-09-02 ENCOUNTER — Encounter (HOSPITAL_COMMUNITY): Payer: Self-pay

## 2017-09-02 DIAGNOSIS — N210 Calculus in bladder: Secondary | ICD-10-CM | POA: Diagnosis not present

## 2017-09-02 DIAGNOSIS — Z01812 Encounter for preprocedural laboratory examination: Secondary | ICD-10-CM | POA: Diagnosis not present

## 2017-09-02 HISTORY — DX: Altered mental status, unspecified: R41.82

## 2017-09-02 HISTORY — DX: Dyspnea, unspecified: R06.00

## 2017-09-02 HISTORY — DX: Heart failure, unspecified: I50.9

## 2017-09-02 HISTORY — DX: Atelectasis: J98.11

## 2017-09-02 HISTORY — DX: Calculus in bladder: N21.0

## 2017-09-02 HISTORY — DX: Other pulmonary embolism without acute cor pulmonale: I26.99

## 2017-09-02 HISTORY — DX: Personal history of urinary calculi: Z87.442

## 2017-09-02 HISTORY — DX: Localized swelling, mass and lump, unspecified: R22.9

## 2017-09-02 HISTORY — DX: Other cardiomyopathies: I42.8

## 2017-09-02 LAB — CBC
HCT: 51.5 % (ref 39.0–52.0)
Hemoglobin: 16.9 g/dL (ref 13.0–17.0)
MCH: 25.9 pg — ABNORMAL LOW (ref 26.0–34.0)
MCHC: 32.8 g/dL (ref 30.0–36.0)
MCV: 78.9 fL (ref 78.0–100.0)
Platelets: 176 10*3/uL (ref 150–400)
RBC: 6.53 MIL/uL — ABNORMAL HIGH (ref 4.22–5.81)
RDW: 14.5 % (ref 11.5–15.5)
WBC: 7.9 10*3/uL (ref 4.0–10.5)

## 2017-09-02 LAB — BASIC METABOLIC PANEL
Anion gap: 7 (ref 5–15)
BUN: 16 mg/dL (ref 6–20)
CO2: 28 mmol/L (ref 22–32)
Calcium: 9.5 mg/dL (ref 8.9–10.3)
Chloride: 102 mmol/L (ref 101–111)
Creatinine, Ser: 0.83 mg/dL (ref 0.61–1.24)
GFR calc Af Amer: >60 mL/min (ref >60)
GFR calc non Af Amer: >60 mL/min (ref >60)
Glucose, Bld: 116 mg/dL — ABNORMAL HIGH (ref 65–99)
Potassium: 4 mmol/L (ref 3.5–5.1)
Sodium: 137 mmol/L (ref 135–145)

## 2017-09-02 LAB — GLUCOSE, CAPILLARY: Glucose-Capillary: 128 mg/dL — ABNORMAL HIGH (ref 65–99)

## 2017-09-02 NOTE — Pre-Procedure Instructions (Signed)
CBC and BMP results 09/02/17 faxed via epic.

## 2017-09-03 ENCOUNTER — Encounter: Payer: Self-pay | Admitting: *Deleted

## 2017-09-07 ENCOUNTER — Encounter (HOSPITAL_COMMUNITY): Payer: Self-pay

## 2017-09-07 ENCOUNTER — Ambulatory Visit (HOSPITAL_COMMUNITY): Payer: PPO | Admitting: Certified Registered"

## 2017-09-07 ENCOUNTER — Ambulatory Visit (HOSPITAL_COMMUNITY)
Admission: RE | Admit: 2017-09-07 | Discharge: 2017-09-07 | Disposition: A | Discharge status: Home / Self Care | Payer: PPO | Source: Ambulatory Visit | Attending: Urology | Admitting: Urology

## 2017-09-07 ENCOUNTER — Encounter (HOSPITAL_COMMUNITY)
Admission: RE | Disposition: A | Discharge status: Home / Self Care | Payer: Self-pay | Source: Ambulatory Visit | Attending: Urology

## 2017-09-07 DIAGNOSIS — R3915 Urgency of urination: Secondary | ICD-10-CM | POA: Insufficient documentation

## 2017-09-07 DIAGNOSIS — R351 Nocturia: Secondary | ICD-10-CM | POA: Diagnosis not present

## 2017-09-07 DIAGNOSIS — E119 Type 2 diabetes mellitus without complications: Secondary | ICD-10-CM | POA: Insufficient documentation

## 2017-09-07 DIAGNOSIS — I509 Heart failure, unspecified: Secondary | ICD-10-CM | POA: Insufficient documentation

## 2017-09-07 DIAGNOSIS — Z7984 Long term (current) use of oral hypoglycemic drugs: Secondary | ICD-10-CM | POA: Diagnosis not present

## 2017-09-07 DIAGNOSIS — Z880 Allergy status to penicillin: Secondary | ICD-10-CM | POA: Diagnosis not present

## 2017-09-07 DIAGNOSIS — Z7901 Long term (current) use of anticoagulants: Secondary | ICD-10-CM | POA: Insufficient documentation

## 2017-09-07 DIAGNOSIS — Z888 Allergy status to other drugs, medicaments and biological substances status: Secondary | ICD-10-CM | POA: Insufficient documentation

## 2017-09-07 DIAGNOSIS — I10 Essential (primary) hypertension: Secondary | ICD-10-CM | POA: Diagnosis not present

## 2017-09-07 DIAGNOSIS — Z87891 Personal history of nicotine dependence: Secondary | ICD-10-CM | POA: Diagnosis not present

## 2017-09-07 DIAGNOSIS — N210 Calculus in bladder: Secondary | ICD-10-CM | POA: Insufficient documentation

## 2017-09-07 DIAGNOSIS — I110 Hypertensive heart disease with heart failure: Secondary | ICD-10-CM | POA: Insufficient documentation

## 2017-09-07 DIAGNOSIS — I2699 Other pulmonary embolism without acute cor pulmonale: Secondary | ICD-10-CM | POA: Diagnosis not present

## 2017-09-07 DIAGNOSIS — Z79899 Other long term (current) drug therapy: Secondary | ICD-10-CM | POA: Insufficient documentation

## 2017-09-07 DIAGNOSIS — Z8249 Family history of ischemic heart disease and other diseases of the circulatory system: Secondary | ICD-10-CM | POA: Insufficient documentation

## 2017-09-07 DIAGNOSIS — N401 Enlarged prostate with lower urinary tract symptoms: Secondary | ICD-10-CM | POA: Insufficient documentation

## 2017-09-07 DIAGNOSIS — Z86711 Personal history of pulmonary embolism: Secondary | ICD-10-CM | POA: Diagnosis not present

## 2017-09-07 HISTORY — PX: CYSTOSCOPY WITH LITHOLAPAXY: SHX1425

## 2017-09-07 LAB — GLUCOSE, CAPILLARY
Glucose-Capillary: 96 mg/dL (ref 65–99)
Glucose-Capillary: 116 mg/dL — ABNORMAL HIGH (ref 65–99)

## 2017-09-07 SURGERY — CYSTOSCOPY, WITH BLADDER CALCULUS LITHOLAPAXY
Anesthesia: General

## 2017-09-07 MED ORDER — FENTANYL CITRATE (PF) 100 MCG/2ML IJ SOLN
INTRAMUSCULAR | Status: DC | PRN
  Administered 2017-09-07 (×4): 50 ug via INTRAVENOUS

## 2017-09-07 MED ORDER — FENTANYL CITRATE (PF) 100 MCG/2ML IJ SOLN
INTRAMUSCULAR | Status: AC
  Filled 2017-09-07: qty 2

## 2017-09-07 MED ORDER — LIDOCAINE 2% (20 MG/ML) 5 ML SYRINGE
INTRAMUSCULAR | Status: AC
Start: 2017-09-07 — End: 2017-09-07
  Filled 2017-09-07: qty 5

## 2017-09-07 MED ORDER — STERILE WATER FOR IRRIGATION IR SOLN
Status: DC | PRN
Start: 2017-09-07 — End: 2017-09-07
  Administered 2017-09-07: 3000 mL

## 2017-09-07 MED ORDER — HYDROCODONE-ACETAMINOPHEN 10-325 MG PO TABS: 1.0000 | ORAL_TABLET | ORAL | 0 refills | Status: DC | PRN

## 2017-09-07 MED ORDER — LIDOCAINE HCL 2 % EX GEL
CUTANEOUS | Status: AC
  Filled 2017-09-07: qty 5

## 2017-09-07 MED ORDER — LACTATED RINGERS IV SOLN
INTRAVENOUS | Status: DC | PRN
  Administered 2017-09-07 (×2): via INTRAVENOUS

## 2017-09-07 MED ORDER — BELLADONNA-OPIUM 16.2-30 MG RE SUPP
RECTAL | Status: AC
Start: 2017-09-07 — End: 2017-09-07
  Filled 2017-09-07: qty 1

## 2017-09-07 MED ORDER — ONDANSETRON HCL 4 MG/2ML IJ SOLN
INTRAMUSCULAR | Status: AC
  Filled 2017-09-07: qty 2

## 2017-09-07 MED ORDER — CIPROFLOXACIN IN D5W 400 MG/200ML IV SOLN
400.0000 mg | Freq: Once | INTRAVENOUS | Status: AC
  Administered 2017-09-07: 400 mg via INTRAVENOUS
  Filled 2017-09-07: qty 200

## 2017-09-07 MED ORDER — ONDANSETRON HCL 4 MG/2ML IJ SOLN
INTRAMUSCULAR | Status: DC | PRN
  Administered 2017-09-07: 4 mg via INTRAVENOUS

## 2017-09-07 MED ORDER — MIDAZOLAM HCL 5 MG/5ML IJ SOLN
INTRAMUSCULAR | Status: DC | PRN
  Administered 2017-09-07: 2 mg via INTRAVENOUS

## 2017-09-07 MED ORDER — LIDOCAINE 2% (20 MG/ML) 5 ML SYRINGE
INTRAMUSCULAR | Status: DC | PRN
  Administered 2017-09-07: 80 mg via INTRAVENOUS

## 2017-09-07 MED ORDER — PROPOFOL 10 MG/ML IV BOLUS
INTRAVENOUS | Status: AC
  Filled 2017-09-07: qty 20

## 2017-09-07 MED ORDER — SODIUM CHLORIDE 0.9 % IR SOLN
Status: DC | PRN
Start: 2017-09-07 — End: 2017-09-07
  Administered 2017-09-07: 1000 mL

## 2017-09-07 MED ORDER — PROPOFOL 10 MG/ML IV BOLUS
INTRAVENOUS | Status: DC | PRN
Start: 2017-09-07 — End: 2017-09-07
  Administered 2017-09-07: 200 mg via INTRAVENOUS

## 2017-09-07 MED ORDER — MIDAZOLAM HCL 2 MG/2ML IJ SOLN
INTRAMUSCULAR | Status: AC
  Filled 2017-09-07: qty 2

## 2017-09-07 MED ORDER — FENTANYL CITRATE (PF) 100 MCG/2ML IJ SOLN: 25.0000 ug | INTRAMUSCULAR | Status: DC | PRN

## 2017-09-07 MED ORDER — PHENAZOPYRIDINE HCL 200 MG PO TABS: 200.0000 mg | ORAL_TABLET | Freq: Three times a day (TID) | ORAL | 0 refills | Status: DC | PRN

## 2017-09-07 SURGICAL SUPPLY — 12 items
BAG URO CATCHER STRL LF (MISCELLANEOUS) ×2 IMPLANT
BLADE 10 SAFETY STRL DISP (BLADE) ×2 IMPLANT
CLOTH BEACON ORANGE TIMEOUT ST (SAFETY) ×2 IMPLANT
COVER FOOTSWITCH UNIV (MISCELLANEOUS) IMPLANT
COVER SURGICAL LIGHT HANDLE (MISCELLANEOUS) ×2 IMPLANT
EVACUATOR MICROVAS BLADDER (UROLOGICAL SUPPLIES) ×2 IMPLANT
FIBER LASER FLEXIVA 1000 (UROLOGICAL SUPPLIES) ×2 IMPLANT
GLOVE BIOGEL M 8.0 STRL (GLOVE) ×2 IMPLANT
GOWN STRL REUS W/TWL XL LVL3 (GOWN DISPOSABLE) ×2 IMPLANT
MANIFOLD NEPTUNE II (INSTRUMENTS) ×2 IMPLANT
PACK CYSTO (CUSTOM PROCEDURE TRAY) ×2 IMPLANT
TUBING CONNECTING 10 (TUBING) ×2 IMPLANT

## 2017-09-07 NOTE — Op Note (Signed)
PATIENT:  Todd HaberStephan Fendley  PRE-OPERATIVE DIAGNOSIS: Bladder calculi  POST-OPERATIVE DIAGNOSIS: Same  PROCEDURE: Cystolitholapaxy (aggregate stone size 5 cm)  SURGEON:  Garnett FarmMark C Kym Fenter  INDICATION: Todd Avila is a 67 year old male with a history of bladder calculi which were for the most part asymptomatic until recently when he began to have irritative symptoms.  We discussed the options for treatment and he has elected to proceed with cystolitholapaxy.  ANESTHESIA:  General  EBL:  Minimal  DRAINS: None  LOCAL MEDICATIONS USED:  None  SPECIMEN: Stone given the patient  Description of procedure: After informed consent the patient was taken to the operating room and placed on the table in a supine position. General anesthesia was then administered. Once fully anesthetized the patient was moved to the dorsal lithotomy position and the genitalia were sterilely prepped and draped in standard fashion. An official timeout was then performed.  The 23 French rigid cystoscope with 30 degree lens was passed down the urethra under direct vision.  The urethra was noted to be normal.  The prostatic urethra was noted to have no lesions.  It was elongated with bilobar hypertrophy.  The bladder was then entered and I noted, on full inspection, no tumors or inflammatory lesions.  Both ureteral orifice ease were noted to be of normal configuration and position.  2 large stones were located in the bladder and these were photographed.  A 1000 m holmium laser fiber was then passed through the cystoscope and this was then used to fragment each of the stones completely.  I then used the Microvasive evacuator to evacuate all stone fragments and reinspection revealed no mucosal injury.  No residual stones were noted and an area at the bladder neck that was oozing was fulgurated with the Bugbee electrode.  I then drained the bladder, removed the cystoscope and the patient was taken to the recovery room in  stable and satisfactory condition.  He tolerated the procedure well with no intraoperative complications.  PLAN OF CARE: Discharge to home after PACU  PATIENT DISPOSITION:  PACU - hemodynamically stable.

## 2017-09-07 NOTE — Anesthesia Preprocedure Evaluation (Addendum)
Anesthesia Evaluation  Patient identified by MRN, date of birth, ID band Patient awake    Reviewed: Allergy & Precautions, NPO status , Patient's Chart, lab work & pertinent test results  Airway Mallampati: II  TM Distance: >3 FB     Dental   Pulmonary shortness of breath, pneumonia, former smoker,    breath sounds clear to auscultation       Cardiovascular hypertension, +CHF   Rhythm:Regular Rate:Normal     Neuro/Psych    GI/Hepatic negative GI ROS,   Endo/Other  diabetes  Renal/GU      Musculoskeletal   Abdominal   Peds  Hematology   Anesthesia Other Findings   Reproductive/Obstetrics                            Anesthesia Physical Anesthesia Plan  ASA: III  Anesthesia Plan: General   Post-op Pain Management:    Induction: Intravenous  PONV Risk Score and Plan: 2 and Treatment may vary due to age or medical condition  Airway Management Planned: LMA  Additional Equipment:   Intra-op Plan:   Post-operative Plan: Extubation in OR  Informed Consent: I have reviewed the patients History and Physical, chart, labs and discussed the procedure including the risks, benefits and alternatives for the proposed anesthesia with the patient or authorized representative who has indicated his/her understanding and acceptance.   Dental advisory given  Plan Discussed with:   Anesthesia Plan Comments:        Anesthesia Quick Evaluation

## 2017-09-07 NOTE — H&P (Signed)
HPI: Todd Avila is a 67 year-old male with bladder stones.  He has had no symptoms.   He has not had prior urinary tract or prostate infections.   He does have urgency. He does have frequency. He does have pain or burning when he urinates.   3/618: He reports having a history of BPH as well as a history of renal and bladder calculi.  He had a pulmonary infection and because of that he was getting yearly CT scans. A CT scan in 5/13 revealed punctate renal calculi that were nonobstructing as well as a 2 cm stone within his bladder. At that time he was not having any symptoms but he saw a urologist and was scheduled for surgery but ended up canceling for various reasons. He is really not been symptomatic other than having frequency and urgency with some dysuria that occurs about every 6 months. He said it seems to occur with every goes up to [place]. He managed his voiding symptoms with lifestyle changes and said he starting to get some symptoms.   07/24/17: He returns today with progressive symptoms. He has a 3.5 cm as well as 1.5 cm stone within his bladder at prior appointment. Today, he complains of increasing issues with dysuria, urgency, and frequency. He states he has frequency every hour. He also has to strain on occasion to begin his stream, as well as having some intermittency. He does not necessarily feel that he empties completely. He does have some intermittent episodes of gross hematuria. He denies any fever or flank pain. He states that his symptoms have become more bothersome and he would like to potentially move forward with surgical procedure. Since being seen last he has a fairly extensive changes in his overall medical history. He was admitted on 8/4 with DKA and a blood sugar at 1500. Currently on metformin. He presented again on 8/15 with chest pain and was noted to have acute PE. He is now on Eliquis. Dr. Lucianne Muss is his endocrinogist. Most recent creatinine noted to be 0.88. RUS  from 8/5 showed bladder stones, but no bilateral renal stones or hydronephrosis.   08/26/17: He told me that after he went into a diabetic coma he has subsequently noted significant voiding symptoms with his stream often times cutting off suddenly and also has developed some frequency, urgency as well as dysuria.     ALLERGIES: Penicillin    MEDICATIONS: Metformin Hcl 500 mg tablet  Eliquis 5 mg tablet  Losartan-Hydrochlorothiazide 100 mg-12.5 mg tablet     GU PSH: None   NON-GU PSH: Tonsillectomy    GU PMH: Bladder Stone, He has a 3.5 cm as well as 1.5 cm stone within his bladder. I told him that this is a large stone that typically I would treat in an open fashion however I would be willing to try to treated endoscopically with laser lithotripsy with the understanding that it may require a second look procedure that could potentially prevent him from undergoing an open procedure. He does understand that if left untreated will increase in size. If it increases in size further endoscopic management would become more difficult and he would most likely need an open procedure. He said he wanted to give it some thought and would contact me with his decision. - 10/28/2016 BPH w/LUTS, He has minimal voiding symptoms but my suspicion is that he does have some degree of outlet obstruction which has resulted in his stone formation. - 10/28/2016 Nocturia, He has some nocturia  that the intermittent urgency and frequency are a bother to him at times and also the sensation of discomfort at the termination of urination. We discussed using a standard and if he finds this effective and wanted something stronger he could contact me for a prescription strength of this medication. - 10/28/2016    NON-GU PMH: Diabetes Type 2 DVT, History Hypertension    FAMILY HISTORY: 1 Daughter - Other 1 son - Other Congestive Heart Failure - Father Death of family member - Mother, Father lupus - Sister renal failure -  Mother Rheumatoid arteritis - Sister    Notes: **Pt reports that his father was paternal parent, and that his mother was not his maternal parent.**   SOCIAL HISTORY: Marital Status: Single Preferred Language: English; Ethnicity: Not Hispanic Or Latino; Race: Black or African American Current Smoking Status: Patient does not smoke anymore. Has not smoked since 10/24/2014. Smoked for 15 years. Smoked less than 1/2 pack per day.   Tobacco Use Assessment Completed: Used Tobacco in last 30 days? Drinks 1 drink per day.  Drinks 1 caffeinated drink per day. Patient's occupation is/was retired.    REVIEW OF SYSTEMS:    GU Review Male:   Patient reports frequent urination, hard to postpone urination, burning/ pain with urination, get up at night to urinate, stream starts and stops, trouble starting your stream, and have to strain to urinate . Patient denies leakage of urine, erection problems, and penile pain.  Gastrointestinal (Upper):   Patient denies nausea, vomiting, and indigestion/ heartburn.  Gastrointestinal (Lower):   Patient denies diarrhea and constipation.  Constitutional:   Patient denies fever, night sweats, weight loss, and fatigue.  Skin:   Patient denies skin rash/ lesion and itching.  Eyes:   Patient denies blurred vision and double vision.  Ears/ Nose/ Throat:   Patient denies sore throat and sinus problems.  Hematologic/Lymphatic:   Patient denies swollen glands and easy bruising.  Cardiovascular:   Patient denies leg swelling and chest pains.  Respiratory:   Patient denies cough and shortness of breath.  Endocrine:   Patient denies excessive thirst.  Musculoskeletal:   Patient denies back pain and joint pain.  Neurological:   Patient denies headaches and dizziness.  Psychologic:   Patient denies depression and anxiety.   VITAL SIGNS:    Weight 172 lb / 78.02 kg  Height 66 in / 167.64 cm  BP 128/79 mmHg  Pulse 79 /min  Temperature 97.1 F / 36.1 C  BMI 27.8 kg/m   GU  PHYSICAL EXAMINATION:    Anus and Perineum: No hemorrhoids. No anal stenosis. No rectal fissure, no anal fissure. No edema, no dimple, no perineal tenderness, no anal tenderness.  Scrotum: No lesions. No edema. No cysts. No warts.  Epididymides: Right: no spermatocele, no masses, no cysts, no tenderness, no induration, no enlargement. Left: no spermatocele, no masses, no cysts, no tenderness, no induration, no enlargement.  Testes: No tenderness, no swelling, no enlargement left testes. No tenderness, no swelling, no enlargement right testes. Normal location left testes. Normal location right testes. No mass, no cyst, no varicocele, no hydrocele left testes. No mass, no cyst, no varicocele, no hydrocele right testes.  Urethral Meatus: Normal size. No lesion, no wart, no discharge, no polyp. Normal location.  Penis: Circumcised, no warts, no cracks. No dorsal Peyronie's plaques, no left corporal Peyronie's plaques, no right corporal Peyronie's plaques, no scarring, no warts. No balanitis, no meatal stenosis.  Prostate: Prostate 2 1/2+ size. Left lobe normal  consistency, right lobe normal consistency. Symmetrical lobes. No prostate nodule. Left lobe no tenderness, right lobe no tenderness.   Seminal Vesicles: Nonpalpable.  Sphincter Tone: Normal sphincter. No rectal tenderness. No rectal mass.    MULTI-SYSTEM PHYSICAL EXAMINATION:    Constitutional: Well-nourished. No physical deformities. Normally developed. Good grooming.  Neck: Neck symmetrical, not swollen. Normal tracheal position.  Respiratory: No labored breathing, no use of accessory muscles.   Cardiovascular: Normal temperature, normal extremity pulses, no swelling, no varicosities.  Lymphatic: No enlargement of neck, axillae, groin.  Skin: No paleness, no jaundice, no cyanosis. No lesion, no ulcer, no rash.  Neurologic / Psychiatric: Oriented to time, oriented to place, oriented to person. No depression, no anxiety, no agitation.   Gastrointestinal: No mass, no tenderness, no rigidity, non obese abdomen.  Eyes: Normal conjunctivae. Normal eyelids.  Ears, Nose, Mouth, and Throat: Left ear no scars, no lesions, no masses. Right ear no scars, no lesions, no masses. Nose no scars, no lesions, no masses. Normal hearing. Normal lips.  Musculoskeletal: Normal gait and station of head and neck.    PAST DATA REVIEWED:  Source Of History:  Patient, Healthcare Provider  Records Review:   Previous Doctor Records, Previous Patient Records, POC Tool   08/14/16  PSA  Total PSA 3.21 ng/dl   Notes:                     He was given clearance to proceed with surgery and to stop his Eliquis prior to the procedure.   PROCEDURES:          Urinalysis w/Scope Dipstick Dipstick Cont'd Micro  Color: Yellow Bilirubin: Neg WBC/hpf: 10 - 20/hpf  Appearance: Cloudy Ketones: Neg RBC/hpf: >60/hpf  Specific Gravity: 1.025 Blood: 3+ Bacteria: NS (Not Seen)  pH: 6.5 Protein: 3+ Cystals: NS (Not Seen)  Glucose: Neg Urobilinogen: 1.0 Casts: NS (Not Seen)    Nitrites: Neg Trichomonas: Not Present    Leukocyte Esterase: 2+ Mucous: Not Present      Epithelial Cells: 0 - 5/hpf      Yeast: NS (Not Seen)      Sperm: Not Present    ASSESSMENT/PLAN:         ICD-10 Details  1 GU:   Bladder Stone - N21.0 Stable - He has 2 large bladder calculi. We have discussed in the past the options for treatment including open surgical management versus endoscopic management. He does understand that due to the very large size of his stones this may require a staged procedure but that I would try and eradicating all of his stones at 1 sitting.  2   BPH w/LUTS - N40.1 Stable - He does have known BPH. I feel that most of his voiding symptoms are however secondary to his bladder calculi.              Notes:   I went over the procedure cystolitholapaxy with him today in detail. We discussed the procedure itself and the means by which I would fragment and remove the  stones from his bladder. We discussed the risks and complications associated with this form of procedure as well as the outpatient nature and the probability of success as well as the anticipated postoperative course.   His urine was cultured preoperatively and found to be negative.  He has stopped his Eliquis in preparation for surgery.

## 2017-09-07 NOTE — Anesthesia Procedure Notes (Signed)
Procedure Name: LMA Insertion Date/Time: 09/07/2017 7:34 AM Performed by: [place], Britteny Fiebelkorn C, CRNA Pre-anesthesia Checklist: Patient identified, Emergency Drugs available, Suction available and Patient being monitored Patient Re-evaluated:Patient Re-evaluated prior to induction Oxygen Delivery Method: Circle system utilized Preoxygenation: Pre-oxygenation with 100% oxygen Induction Type: IV induction Ventilation: Mask ventilation without difficulty LMA: LMA inserted LMA Size: 4.0 Number of attempts: 1 Airway Equipment and Method: Bite block Placement Confirmation: positive ETCO2 Tube secured with: Tape Dental Injury: Teeth and Oropharynx as per pre-operative assessment

## 2017-09-07 NOTE — Transfer of Care (Signed)
Immediate Anesthesia Transfer of Care Note  Patient: Todd HaberStephan Hammad  Procedure(s) Performed: CYSTOSCOPY WITH LITHOLAPAXY (N/A )  Patient Location: PACU  Anesthesia Type:General  Level of Consciousness: awake  Airway & Oxygen Therapy: Patient Spontanous Breathing and Patient connected to face mask oxygen  Post-op Assessment: reviewed and stable Post vital signs: Reviewed and stable  Last Vitals:  Vitals:   09/07/17 0538  BP: (!) 156/101  Pulse: 95  Resp: 16  Temp: 36.8 C  SpO2: 100%    Last Pain:  Vitals:   09/07/17 0538  TempSrc: Oral         Complications: No apparent anesthesia complications

## 2017-09-07 NOTE — Anesthesia Postprocedure Evaluation (Signed)
Anesthesia Post Note  Patient: Todd HaberStephan Avila  Procedure(s) Performed: CYSTOSCOPY WITH LITHOLAPAXY (N/A )     Patient location during evaluation: PACU Anesthesia Type: General Level of consciousness: awake Pain management: pain level controlled Vital Signs Assessment: post-procedure vital signs reviewed and stable Respiratory status: spontaneous breathing Cardiovascular status: stable Anesthetic complications: no    Last Vitals:  Vitals:   09/07/17 0915 09/07/17 0930  BP: (!) 155/103 (!) 159/97  Pulse: 84   Resp: 12   Temp:  36.6 C  SpO2: 97%     Last Pain:  Vitals:   09/07/17 0915  TempSrc:   PainSc: 2                  Khyler Eschmann

## 2017-09-08 ENCOUNTER — Encounter (HOSPITAL_COMMUNITY): Payer: Self-pay | Admitting: Urology

## 2017-09-21 DIAGNOSIS — N210 Calculus in bladder: Secondary | ICD-10-CM | POA: Diagnosis not present

## 2017-09-23 ENCOUNTER — Other Ambulatory Visit: Payer: Self-pay | Admitting: *Deleted

## 2017-09-23 NOTE — Patient Outreach (Signed)
[facility]) Care Management  09/23/2017   Todd Avila 28-Feb-1951 [id number]  Subjective: RN Health Coach telephone call to patient.  [employer] compliance verified. Per patient he is doing great. Patient fastin blood sugar is 95. Per patient it usually runs 85-95. Patient A1C is 5.3. Patient has had the bladder surgery. Per patient he is not having any pain at this time. His urination has returned to normal. RN informed patient at this time will close case since he has met his goals. Patient understands he can call at any time if we need to reopen or has questions.   Current Medications:  Current Outpatient Medications  Medication Sig Dispense Refill  . apixaban (ELIQUIS) 5 MG TABS tablet Take 2 tablets twice daily for 7 days, then take 1 tablet twice daily. (Patient taking differently: Take 5 mg by mouth 2 (two) times daily. ) 70 tablet 0  . HYDROcodone-acetaminophen (NORCO) 10-325 MG tablet Take 1-2 tablets by mouth every 4 (four) hours as needed for moderate pain. Maximum dose per 24 hours - 8 pills 8 tablet 0  . losartan (COZAAR) 25 MG tablet Take 12.5 mg by mouth daily.     . metFORMIN (GLUCOPHAGE-XR) 500 MG 24 hr tablet Take 4 tablets (2,000 mg total) by mouth daily with supper. (Patient taking differently: Take 1,500 mg by mouth daily with supper. ) 120 tablet 3  . phenazopyridine (PYRIDIUM) 200 MG tablet Take 1 tablet (200 mg total) by mouth 3 (three) times daily as needed for pain. 12 tablet 0   No current facility-administered medications for this visit.     Functional Status:  In your present state of health, do you have any difficulty performing the following activities: 09/23/2017 09/02/2017  Hearing? N N  Vision? N N  Difficulty concentrating or making decisions? N N  Walking or climbing stairs? N N  Dressing or bathing? N N  Doing errands, shopping? N N  Preparing Food and eating ? N -  Using the Toilet? N -  In the past six months, have you accidently  leaked urine? N -  Comment - -  Do you have problems with loss of bowel control? N -  Managing your Medications? N -  Managing your Finances? N -  Housekeeping or managing your Housekeeping? N -  Some recent data might be hidden    Fall/Depression Screening: Fall Risk  09/23/2017 07/30/2017 05/05/2017  Falls in the past year? No No No   PHQ 2/9 Scores 09/23/2017 07/30/2017 05/05/2017 04/28/2017  PHQ - 2 Score 0 0 0 0    Assessment:  A1C 5.3 Fasting blood sugar is 95 Pat is not having any pain Patient has had bladder surgery Patient is watching diet Patient understands signs and symptoms of hyper and hypoglycemia  Plan:  Patient has met his goals of care Case closure PCP will be notified of case closure CMA will be notified of case closure  [facility] [phone]

## 2017-09-23 NOTE — Telephone Encounter (Signed)
This encounter was created in error - please disregard.

## 2017-09-30 ENCOUNTER — Other Ambulatory Visit: Payer: Self-pay | Admitting: Endocrinology

## 2017-10-22 ENCOUNTER — Other Ambulatory Visit: Payer: Self-pay | Admitting: Endocrinology

## 2017-10-23 ENCOUNTER — Other Ambulatory Visit (INDEPENDENT_AMBULATORY_CARE_PROVIDER_SITE_OTHER): Payer: PPO

## 2017-10-23 DIAGNOSIS — E119 Type 2 diabetes mellitus without complications: Secondary | ICD-10-CM | POA: Diagnosis not present

## 2017-10-23 LAB — BASIC METABOLIC PANEL
BUN: 16 mg/dL (ref 6–23)
CO2: 29 mEq/L (ref 19–32)
Calcium: 9.7 mg/dL (ref 8.4–10.5)
Chloride: 105 mEq/L (ref 96–112)
Creatinine, Ser: 0.93 mg/dL (ref 0.40–1.50)
GFR: 104.37 mL/min (ref >60.00)
Glucose, Bld: 136 mg/dL — ABNORMAL HIGH (ref 70–99)
Potassium: 4.2 mEq/L (ref 3.5–5.1)
Sodium: 140 mEq/L (ref 135–145)

## 2017-10-23 LAB — MICROALBUMIN / CREATININE URINE RATIO
Creatinine,U: 170.3 mg/dL
Microalb Creat Ratio: 4.2 mg/g (ref 0.0–30.0)
Microalb, Ur: 7.2 mg/dL — ABNORMAL HIGH (ref 0.0–1.9)

## 2017-10-23 LAB — HEMOGLOBIN A1C: Hgb A1c MFr Bld: 5.1 % (ref 4.6–6.5)

## 2017-10-26 ENCOUNTER — Encounter: Payer: Self-pay | Admitting: Endocrinology

## 2017-10-26 ENCOUNTER — Ambulatory Visit (INDEPENDENT_AMBULATORY_CARE_PROVIDER_SITE_OTHER): Payer: PPO | Admitting: Endocrinology

## 2017-10-26 VITALS — BP 118/84 | HR 92 | Ht 67.0 in | Wt 185.8 lb

## 2017-10-26 DIAGNOSIS — E119 Type 2 diabetes mellitus without complications: Secondary | ICD-10-CM | POA: Diagnosis not present

## 2017-10-26 NOTE — Progress Notes (Signed)
Patient ID: Todd Avila, male   DOB: 01/31/1951, 67 y.o.   MRN: [id number]          Reason for Appointment:  Follow-up for Type 2 Diabetes  Referring physician: [name]   History of Present Illness:          Date of diagnosis of type 2 diabetes mellitus: 8/18       Background history:   Patient had gained weight last year after quitting smoking and was up to 196 pounds. Subsequently he started improving his diet and exercising at the gym and was able to come down 282 pounds in June After that he was having increased frequency of urination but he thought this was related to his bladder stone, also has having dry mouth and blurred vision and continued to lose weight He was admitted to the hospital on 8/4 with a blood sugar of 1492 without acidosis and minimal ketonuria. He was discharged on Lantus and NovoLog  Recent history:   His A1c is now 5.1 compared to 5.3 in December  Non-insulin hypoglycemic drugs the patient is taking are: Metformin ER 1500 mg daily  Current management, blood sugar patterns and problems identified:  With his blood sugars staying practically normal on his last visit and A1c 5.3 was told to cut back on his metformin down to 1500 mg instead of 2000  However although he was recommended starting exercise he has not been able to start this as yet  His blood sugars at home are looking fairly close to normal although not clear how accurate his FreeStyle Sensor recently he is getting 6% of the readings below 70 and he is not symptomatic with these  Now he is checking his blood sugar only in the morning and unclear whether he is having any high readings at the postprandial times  Appears that his readings later at night may approach 160 on some days but his average sugar at bedtime is still 144  He thinks he can start going to the gym for exercise next week  He has not gone back to the dietitian recommended for follow-up        Compliance with the  medical regimen: Excellent  Glucose monitoring:          Glucometer:  One Touch ultra 2 and freestyle Libre .      Blood Glucose readings by current monitoring devices:  SENSOR average 93 AVERAGE from today ranges between 60 up to 116 By time of dayRanges between 73 before 6 AM and 124 after 10 PM  GLUCOSE meter: FASTING average about 89, EVENING average, 9-11 PM about 114 Overall median 91  Self-care: The diet that the patient has been following is: tries to limit [employer] Rx, high sugar foods and drinks .      Typical meal intake: Breakfast is eggs, grits, cereal Snacks: Chips, pretzels, nuts                Dietician visit, most recent: 05/05/17               Exercise:  none   Weight history: maximum 196  Wt Readings from Last 3 Encounters:  10/26/17 185 lb 12.8 oz (84.3 kg)  09/07/17 174 lb (78.9 kg)  09/02/17 174 lb (78.9 kg)    Glycemic control:   Lab Results  Component Value Date   HGBA1C 5.1 10/23/2017   HGBA1C 5.3 07/31/2017   HGBA1C 14.5 (H) 03/28/2017   Lab Results  Component Value Date  MICROALBUR 7.2 (H) 10/23/2017   LDLCALC 76 07/31/2017   CREATININE 0.93 10/23/2017   Lab Results  Component Value Date   MICRALBCREAT 4.2 10/23/2017    Lab Results  Component Value Date   FRUCTOSAMINE 225 06/26/2017      Allergies as of 10/26/2017      Reactions   Amlodipine Other (See Comments)   "Feet Swelling"   Penicillins Other (See Comments)   Unknown Has patient had a PCN reaction causing immediate rash, facial/tongue/throat swelling, SOB or lightheadedness with hypotension: Unknown Has patient had a PCN reaction causing severe rash involving mucus membranes or skin necrosis: Unknown Has patient had a PCN reaction that required hospitalization: Unknown Has patient had a PCN reaction occurring within the last 10 years: No If all of the above answers are "NO", then may proceed with Cephalosporin use.      Medication List        Accurate as of 10/26/17   9:32 AM. Always use your most recent med list.          apixaban 5 MG Tabs tablet Commonly known as:  ELIQUIS Take 2 tablets twice daily for 7 days, then take 1 tablet twice daily.   FREESTYLE LIBRE SENSOR SYSTEM Misc USE ONE SENSOR FOR 10 DAYS.   losartan-hydrochlorothiazide 100-12.5 MG tablet Commonly known as:  HYZAAR Take 1 tablet by mouth daily.   metFORMIN 500 MG 24 hr tablet Commonly known as:  GLUCOPHAGE-XR TAKE 4 TABLETS (2,000 MG TOTAL) BY MOUTH DAILY WITH SUPPER.       Allergies:  Allergies  Allergen Reactions  . Amlodipine Other (See Comments)    "Feet Swelling"  . Penicillins Other (See Comments)    Unknown Has patient had a PCN reaction causing immediate rash, facial/tongue/throat swelling, SOB or lightheadedness with hypotension: Unknown Has patient had a PCN reaction causing severe rash involving mucus membranes or skin necrosis: Unknown Has patient had a PCN reaction that required hospitalization: Unknown Has patient had a PCN reaction occurring within the last 10 years: No If all of the above answers are "NO", then may proceed with Cephalosporin use.     Past Medical History:  Diagnosis Date  . Acute pulmonary embolism (HCC) 04/08/2017  . Altered mental status 03/28/2017   history of, for 3 days  . Atelectasis 04/08/2017   mild right basilar noted on CT chest  . Bladder calculi 03/29/2017   Large noted on US renal  . Bladder stones   . CHF (congestive heart failure) (HCC)    per ECHO  . DKA (diabetic ketoacidoses) (HCC) 03/28/2017   HHS/DKA; hospitalized for 3 days/notes 04/08/2017  . DVT (deep venous thrombosis) (HCC)    Unsure if this is superficial thrombophlebitis, happened in ~2010 in [place]  . Dyspnea 03/2017   per ECHO  . History of kidney stones   . HTN (hypertension)   . Mass 2004   Left side of chest, massive infection per CT  . Nonischemic cardiomyopathy (HCC)    per ECHO  . Pneumonia 2011  . Pulmonary infarct (HCC) 04/08/2017    left lower lung lobe noted on CT of chest  . Type II diabetes mellitus (HCC)    "dx'd 03/28/2017"    Past Surgical History:  Procedure Laterality Date  . COLONOSCOPY    . CYSTOSCOPY WITH LITHOLAPAXY N/A 09/07/2017   Procedure: CYSTOSCOPY WITH LITHOLAPAXY;  Surgeon: Ihor Gully, MD;  Location: [facility] ORS;  Service: Urology;  Laterality: N/A;  . TONSILLECTOMY  1960  Family History  Problem Relation Age of Onset  . Heart failure Father   . Diabetes Sister     Social History:  reports that he quit smoking about 3 years ago. His smoking use included cigarettes. He has a 4.80 pack-year smoking history. he has never used smokeless tobacco. He reports that he drinks alcohol. He reports that he does not use drugs.   Review of Systems   Lipid history: He has never been on statin drugs, LDL is excellent    Lab Results  Component Value Date   CHOL 131 07/31/2017   HDL 42.90 07/31/2017   LDLCALC 76 07/31/2017   TRIG 58.0 07/31/2017   CHOLHDL 3 07/31/2017           Hypertension:Treated with losartan and low dose for about 1 year and is followed by PCP  Most recent eye exam was 9/18  Most recent foot exam:10/18   Physical Examination:  BP 118/84 (BP Location: Left Arm, Patient Position: Sitting, Cuff Size: Normal)   Pulse 92   Ht 5\' 7"  (1.702 m)   Wt 185 lb 12.8 oz (84.3 kg)   SpO2 97%   BMI 29.10 kg/m      ASSESSMENT:  Diabetes type 2, nonobese with severe hypoglycemia and nonketotic hyperosmolar state at diagnosis   See history of present illness for description of current diabetes management, blood sugar patterns and problems identified  A1c is in the normal range at 5.1, slightly lower than before This is despite his gaining weight since his last visit and not exercising Currently taking only 1500 mg of metformin, previously 2000  Urine microalbumin is now normal, previously high   PLAN:     Since his blood sugars are still quite normal he can leave off his  metformin when he starts exercise and hopefully his blood sugars will stay stable  He will let us know if his blood sugars start going up  However discussed importance of checking readings after meals which he is not doing so  May also consider follow-up consultation with dietitian  Discussed insulin resistance and benefits of exercise  Follow-up in 3 months   There are no Patient Instructions on file for this visit.    Reather LittlerAjay Talulah Schirmer 10/26/2017, 9:32 AM   Note: This office note was prepared with Dragon voice recognition system technology. Any transcriptional errors that result from this process are unintentional.

## 2017-10-26 NOTE — Patient Instructions (Addendum)
Check blood sugars on waking up    Also check blood sugars about 2 hours after a meal and do this after different meals by rotation  Recommended blood sugar levels on waking up is 70-120 and about 2 hours after meal is 130-160  Please bring your blood sugar monitor to each visit, thank you  Stop metformin when exercising and watch sugars

## 2017-11-18 DIAGNOSIS — Z79899 Other long term (current) drug therapy: Secondary | ICD-10-CM | POA: Diagnosis not present

## 2017-11-18 DIAGNOSIS — I10 Essential (primary) hypertension: Secondary | ICD-10-CM | POA: Diagnosis not present

## 2017-11-18 DIAGNOSIS — Z1211 Encounter for screening for malignant neoplasm of colon: Secondary | ICD-10-CM | POA: Diagnosis not present

## 2017-11-18 DIAGNOSIS — Z125 Encounter for screening for malignant neoplasm of prostate: Secondary | ICD-10-CM | POA: Diagnosis not present

## 2017-11-18 DIAGNOSIS — Z23 Encounter for immunization: Secondary | ICD-10-CM | POA: Diagnosis not present

## 2017-11-18 DIAGNOSIS — Z0000 Encounter for general adult medical examination without abnormal findings: Secondary | ICD-10-CM | POA: Diagnosis not present

## 2017-11-20 DIAGNOSIS — Z1211 Encounter for screening for malignant neoplasm of colon: Secondary | ICD-10-CM | POA: Diagnosis not present

## 2018-01-20 ENCOUNTER — Other Ambulatory Visit (INDEPENDENT_AMBULATORY_CARE_PROVIDER_SITE_OTHER): Payer: PPO

## 2018-01-20 DIAGNOSIS — E119 Type 2 diabetes mellitus without complications: Secondary | ICD-10-CM

## 2018-01-20 LAB — BASIC METABOLIC PANEL
BUN: 14 mg/dL (ref 6–23)
CO2: 27 mEq/L (ref 19–32)
Calcium: 9.2 mg/dL (ref 8.4–10.5)
Chloride: 107 mEq/L (ref 96–112)
Creatinine, Ser: 0.92 mg/dL (ref 0.40–1.50)
GFR: 105.6 mL/min (ref >60.00)
Glucose, Bld: 107 mg/dL — ABNORMAL HIGH (ref 70–99)
Potassium: 4 mEq/L (ref 3.5–5.1)
Sodium: 140 mEq/L (ref 135–145)

## 2018-01-20 LAB — HEMOGLOBIN A1C: Hgb A1c MFr Bld: 5.6 % (ref 4.6–6.5)

## 2018-01-21 ENCOUNTER — Other Ambulatory Visit: Payer: PPO

## 2018-01-26 ENCOUNTER — Encounter: Payer: Self-pay | Admitting: Endocrinology

## 2018-01-26 ENCOUNTER — Ambulatory Visit (INDEPENDENT_AMBULATORY_CARE_PROVIDER_SITE_OTHER): Payer: PPO | Admitting: Endocrinology

## 2018-01-26 VITALS — BP 130/78 | HR 77 | Ht 67.0 in | Wt 188.4 lb

## 2018-01-26 DIAGNOSIS — E119 Type 2 diabetes mellitus without complications: Secondary | ICD-10-CM | POA: Diagnosis not present

## 2018-01-26 MED ORDER — FREESTYLE LIBRE 14 DAY READER DEVI: 1.0000 | Freq: Once | 0 refills | Status: AC

## 2018-01-26 MED ORDER — FREESTYLE LIBRE 14 DAY SENSOR MISC: 1.0000 [IU] | 4 refills | Status: DC

## 2018-01-26 NOTE — Patient Instructions (Signed)
More sugars after dinner 

## 2018-01-26 NOTE — Progress Notes (Signed)
Patient ID: Todd Avila, male   DOB: August 31, 1950, 67 y.o.   MRN: [id number]          Reason for Appointment:  Follow-up for Type 2 Diabetes  Referring physician: [name]   History of Present Illness:          Date of diagnosis of type 2 diabetes mellitus: 8/18       Background history:   Patient had gained weight last year after quitting smoking and was up to 196 pounds. Subsequently he started improving his diet and exercising at the gym and was able to come down  To 182 pounds in June After that he was having increased frequency of urination but he thought this was related to his bladder stone, also has having dry mouth and blurred vision and continued to lose weight He was admitted to the hospital on 8/4 with a blood sugar of 1492 without acidosis and minimal ketonuria. He was discharged on Lantus and NovoLog  Recent history:   His A1c is now 5.6, previous range 5.1-5.3    Non-insulin hypoglycemic drugs the patient is taking are: Previously on Metformin ER 1500 mg daily, currently none  Current management, blood sugar patterns and problems identified:  With his blood sugars staying close to normal on his last visit he was told to stop the 1500 mg metformin that he was taking and he is here for follow-up now  He continues to monitor his blood sugar with the freestyle libre  However he is checking his blood sugars mostly in the mornings and difficult to get a pattern of his blood sugars the rest of the day with only sporadic readings before lunch and supper  He was starting to go to the gym to exercise but because of traveling he has done this much but is also starting to walk with his wife in the mornings  He says that he was having somewhat higher readings up to 140 when eating out on his travels but otherwise his blood sugars are fairly close to normal  His lab glucose was 107 compared to about 100 at home the same morning about an hour apart  Although his blood  sugars appear to be low normal early morning he has no hypoglycemic symptoms at any time  His weight has gone up 3 pounds        Compliance with the medical regimen: Excellent  Glucose monitoring:          Glucometer:  One Touch ultra 2 and freestyle Libre .      Blood Glucose readings by current monitoring devices:  SENSOR average 94 with most readings reflecting blood sugars overnight only   Self-care: The diet that the patient has been following is: tries to limit [employer] Rx, high sugar foods and drinks .      Typical meal intake: Breakfast is eggs, grits, cereal Snacks: Fruit, pretzels, nuts                Dietician visit, most recent: 05/05/17               Exercise:  At [facility] or walking while traveling, some walking in the park  Weight history: maximum 196  Wt Readings from Last 3 Encounters:  01/26/18 188 lb 6.4 oz (85.5 kg)  10/26/17 185 lb 12.8 oz (84.3 kg)  09/07/17 174 lb (78.9 kg)    Glycemic control:   Lab Results  Component Value Date   HGBA1C 5.6 01/20/2018   HGBA1C  5.1 10/23/2017   HGBA1C 5.3 07/31/2017   Lab Results  Component Value Date   MICROALBUR 7.2 (H) 10/23/2017   LDLCALC 76 07/31/2017   CREATININE 0.92 01/20/2018   Lab Results  Component Value Date   MICRALBCREAT 4.2 10/23/2017    Lab Results  Component Value Date   FRUCTOSAMINE 225 06/26/2017      Allergies as of 01/26/2018      Reactions   Amlodipine Other (See Comments)   "Feet Swelling"   Penicillins Other (See Comments)   Unknown Has patient had a PCN reaction causing immediate rash, facial/tongue/throat swelling, SOB or lightheadedness with hypotension: Unknown Has patient had a PCN reaction causing severe rash involving mucus membranes or skin necrosis: Unknown Has patient had a PCN reaction that required hospitalization: Unknown Has patient had a PCN reaction occurring within the last 10 years: No If all of the above answers are "NO", then may proceed with Cephalosporin  use.      Medication List        Accurate as of 01/26/18  8:26 AM. Always use your most recent med list.          apixaban 5 MG Tabs tablet Commonly known as:  ELIQUIS Take 2 tablets twice daily for 7 days, then take 1 tablet twice daily.   FREESTYLE LIBRE SENSOR SYSTEM Misc USE ONE SENSOR FOR 10 DAYS.   losartan-hydrochlorothiazide 100-12.5 MG tablet Commonly known as:  HYZAAR Take 1 tablet by mouth daily.       Allergies:  Allergies  Allergen Reactions  . Amlodipine Other (See Comments)    "Feet Swelling"  . Penicillins Other (See Comments)    Unknown Has patient had a PCN reaction causing immediate rash, facial/tongue/throat swelling, SOB or lightheadedness with hypotension: Unknown Has patient had a PCN reaction causing severe rash involving mucus membranes or skin necrosis: Unknown Has patient had a PCN reaction that required hospitalization: Unknown Has patient had a PCN reaction occurring within the last 10 years: No If all of the above answers are "NO", then may proceed with Cephalosporin use.     Past Medical History:  Diagnosis Date  . Acute pulmonary embolism (HCC) 04/08/2017  . Altered mental status 03/28/2017   history of, for 3 days  . Atelectasis 04/08/2017   mild right basilar noted on CT chest  . Bladder calculi 03/29/2017   Large noted on US renal  . Bladder stones   . CHF (congestive heart failure) (HCC)    per ECHO  . DKA (diabetic ketoacidoses) (HCC) 03/28/2017   HHS/DKA; hospitalized for 3 days/notes 04/08/2017  . DVT (deep venous thrombosis) (HCC)    Unsure if this is superficial thrombophlebitis, happened [place] in [place]  . Dyspnea 03/2017   per ECHO  . History of kidney stones   . HTN (hypertension)   . Mass 2004   Left side of chest, massive infection per CT  . Nonischemic cardiomyopathy (HCC)    per ECHO  . Pneumonia 2011  . Pulmonary infarct (HCC) 04/08/2017   left lower lung lobe noted on CT of chest  . Type II diabetes  mellitus (HCC)    "dx'd 03/28/2017"    Past Surgical History:  Procedure Laterality Date  . COLONOSCOPY    . CYSTOSCOPY WITH LITHOLAPAXY N/A 09/07/2017   Procedure: CYSTOSCOPY WITH LITHOLAPAXY;  Surgeon: Ihor Gullyttelin, Mark, MD;  Location: [facility];  Service: Urology;  Laterality: N/A;  . TONSILLECTOMY  1960    Family History  Problem Relation Age  of Onset  . Heart failure Father   . Diabetes Sister     Social History:  reports that he quit smoking about 3 years ago. His smoking use included cigarettes. He has a 4.80 pack-year smoking history. He has never used smokeless tobacco. He reports that he drinks alcohol. He reports that he does not use drugs.   Review of Systems  No fatigue: He says that he feels much better than before because of healthier lifestyle  Lipid history: He has never been on statin drugs, LDL is excellent    Lab Results  Component Value Date   CHOL 131 07/31/2017   HDL 42.90 07/31/2017   LDLCALC 76 07/31/2017   TRIG 58.0 07/31/2017   CHOLHDL 3 07/31/2017           Hypertension:Treated with losartan and low dose for about 1 year and is followed by PCP  Most recent eye exam was 9/18  Most recent foot exam:10/18   Physical Examination:  BP 130/78 (BP Location: Left Arm, Patient Position: Sitting, Cuff Size: Normal)   Pulse 77   Ht 5\' 7"  (1.702 m)   Wt 188 lb 6.4 oz (85.5 kg)   SpO2 99%   BMI 29.51 kg/m      ASSESSMENT:  Diabetes type 2, nonobese with severe hypoglycemia and nonketotic hyperosmolar state at diagnosis   See history of present illness for description of current diabetes management, blood sugar patterns and problems identified  A1c is in the normal range at 5.6, slightly higher than before when he was on metformin He is trying to do better with his exercise regimen Also his awareness of his diet and meal planning is fairly good now He has gained weight but this is mostly because of his eating out on vacation recently   PLAN:     Continue on diet and exercise regimen alone  To check blood sugars more consistently after meals  He can start using the 14-day sensor for his freestyle libre  For now he could use this once a month as he does not need to monitor his blood sugars all the time  Follow-up in 4 months   There are no Patient Instructions on file for this visit.    Reather Littler 01/26/2018, 8:26 AM   Note: This office note was prepared with Dragon voice recognition system technology. Any transcriptional errors that result from this process are unintentional.

## 2018-05-24 ENCOUNTER — Other Ambulatory Visit (INDEPENDENT_AMBULATORY_CARE_PROVIDER_SITE_OTHER): Payer: PPO

## 2018-05-24 DIAGNOSIS — E119 Type 2 diabetes mellitus without complications: Secondary | ICD-10-CM | POA: Diagnosis not present

## 2018-05-24 LAB — LIPID PANEL
Cholesterol: 127 mg/dL (ref 0–200)
HDL: 32.8 mg/dL — ABNORMAL LOW (ref >39.00)
LDL Cholesterol: 58 mg/dL (ref 0–99)
NonHDL: 94.52
Total CHOL/HDL Ratio: 4
Triglycerides: 181 mg/dL — ABNORMAL HIGH (ref 0.0–149.0)
VLDL: 36.2 mg/dL (ref 0.0–40.0)

## 2018-05-24 LAB — BASIC METABOLIC PANEL
BUN: 16 mg/dL (ref 6–23)
CO2: 24 mEq/L (ref 19–32)
Calcium: 8.9 mg/dL (ref 8.4–10.5)
Chloride: 104 mEq/L (ref 96–112)
Creatinine, Ser: 0.93 mg/dL (ref 0.40–1.50)
GFR: 104.18 mL/min (ref >60.00)
Glucose, Bld: 270 mg/dL — ABNORMAL HIGH (ref 70–99)
Potassium: 3.5 mEq/L (ref 3.5–5.1)
Sodium: 138 mEq/L (ref 135–145)

## 2018-05-24 LAB — HEMOGLOBIN A1C: Hgb A1c MFr Bld: 7 % — ABNORMAL HIGH (ref 4.6–6.5)

## 2018-05-27 ENCOUNTER — Encounter: Payer: Self-pay | Admitting: Endocrinology

## 2018-05-27 ENCOUNTER — Ambulatory Visit (INDEPENDENT_AMBULATORY_CARE_PROVIDER_SITE_OTHER): Payer: PPO | Admitting: Endocrinology

## 2018-05-27 VITALS — BP 132/86 | HR 76 | Ht 67.0 in | Wt 192.0 lb

## 2018-05-27 DIAGNOSIS — E1165 Type 2 diabetes mellitus with hyperglycemia: Secondary | ICD-10-CM

## 2018-05-27 MED ORDER — SITAGLIP PHOS-METFORMIN HCL ER 50-1000 MG PO TB24: ORAL_TABLET | ORAL | 2 refills | Status: DC

## 2018-05-27 NOTE — Progress Notes (Signed)
Patient ID: Todd Avila, male   DOB: [birth year], 67 y.o.   MRN: [id number]          Reason for Appointment:  Follow-up for Type 2 Diabetes  Referring physician: [name]   History of Present Illness:          Date of diagnosis of type 2 diabetes mellitus: 8/18       Background history:   Patient had gained weight last year after quitting smoking and was up to 196 pounds. Subsequently he started improving his diet and exercising at the gym and was able to come down  To 182 pounds in June After that he was having increased frequency of urination but he thought this was related to his bladder stone, also has having dry mouth and blurred vision and continued to lose weight He was admitted to the hospital on 8/4 with a blood sugar of 1492 without acidosis and minimal ketonuria. He was discharged on Lantus and NovoLog  Recent history:   His A1c is now 7, previous range 5.1-5.6  Non-insulin hypoglycemic drugs the patient is taking are: Previously on Metformin ER 1500 mg daily, currently none  Current management, blood sugar patterns and problems identified:  He is back for a 60-month follow-up  Although his blood sugars have been getting significantly high over the last 2 months he did not call to report this; previously on his visit his home blood sugar was averaging 94  He checks his blood sugars mostly in the mornings and sometimes at bedtime but only data for 50% of the time available because of infrequent monitor  He appears to have consistently high readings and appear to be significantly higher overnight  Also tends to have high readings after evening meal which is somewhat variable  He says that he has not been consistent with his diet with eating out more  Also for various reasons has not done any exercise recently  His weight has gone up another 4 pounds  Usually not complaining of unusual fatigue or frequent urination        Compliance with the medical  regimen: Excellent  Glucose monitoring:          Glucometer: freestyle Libre .      Blood Glucose patterns by current monitoring device:   PRE-MEAL Fasting Lunch Dinner Bedtime Overall  Glucose range:  134-269      Mean/median: 169   228 195   POST-MEAL PC Breakfast PC Lunch PC Dinner  Glucose range:     Mean/median: 199 199 219      Self-care: The diet that the patient has been following is: tries to limit [employer] Rx, high sugar foods and drinks .      Typical meal intake: Breakfast is eggs, grits, cereal Snacks: Fruit, pretzels, nuts                Dietician visit, most recent: 05/05/17               Exercise:  not At Gym or walking , some walking in the park  Weight history: maximum 196  Wt Readings from Last 3 Encounters:  05/27/18 192 lb (87.1 kg)  01/26/18 188 lb 6.4 oz (85.5 kg)  10/26/17 185 lb 12.8 oz (84.3 kg)    Glycemic control:   Lab Results  Component Value Date   HGBA1C 7.0 (H) 05/24/2018   HGBA1C 5.6 01/20/2018   HGBA1C 5.1 10/23/2017   Lab Results  Component Value Date  MICROALBUR 7.2 (H) 10/23/2017   LDLCALC 58 05/24/2018   CREATININE 0.93 05/24/2018   Lab Results  Component Value Date   MICRALBCREAT 4.2 10/23/2017    Lab Results  Component Value Date   FRUCTOSAMINE 225 06/26/2017      Allergies as of 05/27/2018      Reactions   Amlodipine Other (See Comments)   "Feet Swelling"   Penicillins Other (See Comments)   Unknown Has patient had a PCN reaction causing immediate rash, facial/tongue/throat swelling, SOB or lightheadedness with hypotension: Unknown Has patient had a PCN reaction causing severe rash involving mucus membranes or skin necrosis: Unknown Has patient had a PCN reaction that required hospitalization: Unknown Has patient had a PCN reaction occurring within the last 10 years: No If all of the above answers are "NO", then may proceed with Cephalosporin use.      Medication List        Accurate as of 05/27/18  12:43 PM. Always use your most recent med list.          apixaban 5 MG Tabs tablet Commonly known as:  ELIQUIS Take 2 tablets twice daily for 7 days, then take 1 tablet twice daily.   FREESTYLE LIBRE 14 DAY SENSOR Misc 1 Units by Does not apply route every 14 (fourteen) days.   losartan-hydrochlorothiazide 100-12.5 MG tablet Commonly known as:  HYZAAR Take by mouth daily. Taking 1/2 tablet daily   SitaGLIPtin-MetFORMIN HCl 50-1000 MG Tb24 2 tabs daily       Allergies:  Allergies  Allergen Reactions  . Amlodipine Other (See Comments)    "Feet Swelling"  . Penicillins Other (See Comments)    Unknown Has patient had a PCN reaction causing immediate rash, facial/tongue/throat swelling, SOB or lightheadedness with hypotension: Unknown Has patient had a PCN reaction causing severe rash involving mucus membranes or skin necrosis: Unknown Has patient had a PCN reaction that required hospitalization: Unknown Has patient had a PCN reaction occurring within the last 10 years: No If all of the above answers are "NO", then may proceed with Cephalosporin use.     Past Medical History:  Diagnosis Date  . Acute pulmonary embolism (HCC) 04/08/2017  . Altered mental status 03/28/2017   history of, for 3 days  . Atelectasis 04/08/2017   mild right basilar noted on CT chest  . Bladder calculi 03/29/2017   Large noted on US renal  . Bladder stones   . CHF (congestive heart failure) (HCC)    per ECHO  . DKA (diabetic ketoacidoses) (HCC) 03/28/2017   HHS/DKA; hospitalized for 3 days/notes 04/08/2017  . DVT (deep venous thrombosis) (HCC)    Unsure if this is superficial thrombophlebitis, happened in ~2010 in [place]  . Dyspnea 03/2017   per ECHO  . History of kidney stones   . HTN (hypertension)   . Mass 2004   Left side of chest, massive infection per CT  . Nonischemic cardiomyopathy (HCC)    per ECHO  . Pneumonia 2011  . Pulmonary infarct (HCC) 04/08/2017   left lower lung lobe  noted on CT of chest  . Type II diabetes mellitus (HCC)    "dx'd 03/28/2017"    Past Surgical History:  Procedure Laterality Date  . COLONOSCOPY    . CYSTOSCOPY WITH LITHOLAPAXY N/A 09/07/2017   Procedure: CYSTOSCOPY WITH LITHOLAPAXY;  Surgeon: Ihor Gully, MD;  Location: [facility];  Service: Urology;  Laterality: N/A;  . TONSILLECTOMY  1960    Family History  Problem Relation Age  of Onset  . Heart failure Father   . Diabetes Sister     Social History:  reports that he quit smoking about 3 years ago. His smoking use included cigarettes. He has a 4.80 pack-year smoking history. He has never used smokeless tobacco. He reports that he drinks alcohol. He reports that he does not use drugs.   Review of Systems  Lipid history: He has never been on statin drugs, LDL is excellent    Lab Results  Component Value Date   CHOL 127 05/24/2018   HDL 32.80 (L) 05/24/2018   LDLCALC 58 05/24/2018   TRIG 181.0 (H) 05/24/2018   CHOLHDL 4 05/24/2018           Hypertension:Treated with losartan HCT and is followed by PCP  Most recent eye exam was 9/18  Most recent foot exam:10/18   Physical Examination:  BP 132/86   Pulse 76   Ht 5\' 7"  (1.702 m)   Wt 192 lb (87.1 kg)   SpO2 96%   BMI 30.07 kg/m      ASSESSMENT:  Diabetes type 2, nonobese with severe hypoglycemia and nonketotic hyperosmolar state at diagnosis   See history of present illness for description of current diabetes management, blood sugar patterns and problems identified  His blood sugars are significantly higher than earlier this year even though he was doing well without medications on the last visit  He likely has progression of his diabetes but also has been irregular with his exercise regimen and also not watching his diet consistently Has gained more weight Blood sugars at home are averaging 195 for the last 2 weeks although his freestyle Josephine Igo has only 50% of the time covered by his tracings Most of his high  readings are after supper and overnight  Discussed options for treatment and likely need for continued long-term treatment Also discussed that adding a medication such as a DPP 4 inhibitor will enhance action of metformin on insulin secretion and hopefully stabilize his diabetes with early treatment   PLAN:   Consistent low-fat diet Regular exercise Janumet XR 2 tablets daily, may try 1 tablet daily for the first 3 to 4 days More consistent monitoring after meals Follow-up in 6 weeks  Patient Instructions  Check blood sugars on waking up 5 days a week  Also check blood sugars about 2 hours after meals and do this after different meals by rotation  Recommended blood sugar levels on waking up are 90-130 and about 2 hours after meal is 130-160  Please bring your blood sugar monitor to each visit, thank you       Reather Littler 05/27/2018, 12:43 PM   Note: This office note was prepared with Dragon voice recognition system technology. Any transcriptional errors that result from this process are unintentional.

## 2018-05-27 NOTE — Patient Instructions (Addendum)
Check blood sugars on waking up 5  days a week  Also check blood sugars about 2 hours after meals and do this after different meals by rotation  Recommended blood sugar levels on waking up are 90-130 and about 2 hours after meal is 130-160  Please bring your blood sugar monitor to each visit, thank you  

## 2018-06-08 DIAGNOSIS — M1711 Unilateral primary osteoarthritis, right knee: Secondary | ICD-10-CM | POA: Diagnosis not present

## 2018-06-18 ENCOUNTER — Other Ambulatory Visit: Payer: Self-pay | Admitting: Endocrinology

## 2018-07-06 ENCOUNTER — Other Ambulatory Visit (INDEPENDENT_AMBULATORY_CARE_PROVIDER_SITE_OTHER): Payer: PPO

## 2018-07-06 DIAGNOSIS — E1165 Type 2 diabetes mellitus with hyperglycemia: Secondary | ICD-10-CM | POA: Diagnosis not present

## 2018-07-06 LAB — BASIC METABOLIC PANEL
BUN: 15 mg/dL (ref 6–23)
CO2: 27 mEq/L (ref 19–32)
Calcium: 9.7 mg/dL (ref 8.4–10.5)
Chloride: 102 mEq/L (ref 96–112)
Creatinine, Ser: 0.95 mg/dL (ref 0.40–1.50)
GFR: 101.62 mL/min (ref >60.00)
Glucose, Bld: 131 mg/dL — ABNORMAL HIGH (ref 70–99)
Potassium: 4.1 mEq/L (ref 3.5–5.1)
Sodium: 138 mEq/L (ref 135–145)

## 2018-07-07 LAB — FRUCTOSAMINE: Fructosamine: 282 umol/L (ref 0–285)

## 2018-07-08 ENCOUNTER — Other Ambulatory Visit: Payer: Self-pay

## 2018-07-08 ENCOUNTER — Encounter: Payer: Self-pay | Admitting: Internal Medicine

## 2018-07-08 ENCOUNTER — Ambulatory Visit (INDEPENDENT_AMBULATORY_CARE_PROVIDER_SITE_OTHER): Payer: PPO | Admitting: Internal Medicine

## 2018-07-08 ENCOUNTER — Telehealth: Payer: Self-pay | Admitting: Internal Medicine

## 2018-07-08 VITALS — BP 130/82 | Resp 16 | Ht 67.0 in | Wt 189.0 lb

## 2018-07-08 DIAGNOSIS — E119 Type 2 diabetes mellitus without complications: Secondary | ICD-10-CM

## 2018-07-08 NOTE — Progress Notes (Signed)
Name: Todd Avila  Age/ Sex: 67 y.o., male   MRN/ DOB: [id number], 08/31/1950     PCP: Todd Palmer, MD   Reason for Endocrinology Evaluation: Type 2 Diabetes Mellitus  Initial Endocrine Consultative Visit: 05/28/2017    PATIENT IDENTIFIER: [name] is a 67 y.o. male with a past medical history of HTN, PE and T2DM. The patient has followed with Endocrinology clinic since 2018 for consultative assistance with management of his diabetes.  DIABETIC HISTORY:  [name] was diagnosed with T2DM in August, 2018 when he was admitted for hyperosmolar hyperglycemia with a BG > 1000 mg/dL. At the time of discharge he was on insulin and subsequently was able to stop insulin. His hemoglobin A1c has ranged from 5.1%in  10/2017, peaking at 14.5% in 03/2017.   SUBJECTIVE:   During the last visit (05/27/18 ): A1c 7.0% He was started on Janumet   Today (07/08/2018): [name]  He checks his blood sugars 1 times daily, fasting. The patient has not had hypoglycemic episodes since the last clinic visit. Otherwise, the patient has not required any recent emergency interventions for hypoglycemia and has not had recent hospitalizations secondary to hyper or hypoglycemic episodes.   He has been taking Janumet once a day, he avoids sugar-sweetened beverages , but tends to snack at bedtime (cookies)  He has been exercising.     ROS: As per HPI and as detailed below: Review of Systems  Constitutional: Positive for weight loss. Negative for malaise/fatigue.  HENT: Negative for congestion and sore throat.   Eyes: Negative for blurred vision and pain.  Respiratory: Negative for cough and shortness of breath.   Cardiovascular: Negative for chest pain and palpitations.  Gastrointestinal: Negative for diarrhea and nausea.      HOME DIABETES REGIMEN:  Janumet 50/1000 mg daily       CONTINUOUS GLUCOSE  MONITORING RECORD INTERPRETATION    Dates of Recording: 11/1-11/14/19  Sensor description:Freestyle Libre  Results statistics:   CGM use % of time 73  Average and SD 122/33  Time in range   92     %  % Time Above 180 5  % Time above 250 0  % Time Below target 3    Glycemic patterns summary: Hyperglycemia at bedtime  Hyperglycemic episodes  After bedtime snack   Hypoglycemic episodes occurred in the evening  Overnight periods: no hypoglycemia     HISTORY:  Past Medical History:  Past Medical History:  Diagnosis Date  . Acute pulmonary embolism (HCC) 04/08/2017  . Altered mental status 03/28/2017   history of, for 3 days  . Atelectasis 04/08/2017   mild right basilar noted on CT chest  . Bladder calculi 03/29/2017   Large noted on US renal  . Bladder stones   . CHF (congestive heart failure) (HCC)    per ECHO  . DKA (diabetic ketoacidoses) (HCC) 03/28/2017   HHS/DKA; hospitalized for 3 days/notes 04/08/2017  . DVT (deep venous thrombosis) (HCC)    Unsure if this is superficial thrombophlebitis, happened in ~2010 in [place]  . Dyspnea 03/2017   per ECHO  . History of kidney stones   . HTN (hypertension)   . Mass 2004   Left side of chest, massive infection per CT  . Nonischemic cardiomyopathy (HCC)    per ECHO  . Pneumonia 2011  . Pulmonary infarct (HCC) 04/08/2017   left lower lung lobe noted on CT of chest  . Type II diabetes mellitus (HCC)    "dx'd  03/28/2017"   Past Surgical History:  Past Surgical History:  Procedure Laterality Date  . COLONOSCOPY    . CYSTOSCOPY WITH LITHOLAPAXY N/A 09/07/2017   Procedure: CYSTOSCOPY WITH LITHOLAPAXY;  Surgeon: Ihor Gullyttelin, Mark, MD;  Location: [facility];  Service: Urology;  Laterality: N/A;  . TONSILLECTOMY  1960    Social History:  reports that he quit smoking about 3 years ago. His smoking use included cigarettes. He has a 4.80 pack-year smoking history. He has never used smokeless tobacco. He reports that he drinks alcohol.  He reports that he does not use drugs. Family History:  Family History  Problem Relation Age of Onset  . Heart failure Father   . Diabetes Sister      HOME MEDICATIONS: Allergies as of 07/08/2018      Reactions   Amlodipine Other (See Comments)   "Feet Swelling"   Penicillins Other (See Comments)   Unknown Has patient had a PCN reaction causing immediate rash, facial/tongue/throat swelling, SOB or lightheadedness with hypotension: Unknown Has patient had a PCN reaction causing severe rash involving mucus membranes or skin necrosis: Unknown Has patient had a PCN reaction that required hospitalization: Unknown Has patient had a PCN reaction occurring within the last 10 years: No If all of the above answers are "NO", then may proceed with Cephalosporin use.      Medication List        Accurate as of 07/08/18  8:55 AM. Always use your most recent med list.          apixaban 5 MG Tabs tablet Commonly known as:  ELIQUIS Take 2 tablets twice daily for 7 days, then take 1 tablet twice daily.   FREESTYLE LIBRE 14 DAY SENSOR Misc APPLY 1 UNIT EVERY 14 (FOURTEEN) DAYS.   losartan-hydrochlorothiazide 100-12.5 MG tablet Commonly known as:  HYZAAR Take by mouth daily. Taking 1/2 tablet daily   SitaGLIPtin-MetFORMIN HCl 50-1000 MG Tb24 2 tabs daily        OBJECTIVE:   Vital Signs: Resp 16   Ht 5\' 7"  (1.702 m)   Wt 189 lb (85.7 kg)   SpO2 98%   BMI 29.60 kg/m   Wt Readings from Last 3 Encounters:  07/08/18 189 lb (85.7 kg)  05/27/18 192 lb (87.1 kg)  01/26/18 188 lb 6.4 oz (85.5 kg)     Exam: General: Pt appears well and is in NAD  Hydration: Well-hydrated with moist mucous membranes and good skin turgor  Lungs: Clear with good BS bilat with no rales, rhonchi, or wheezes  Heart: RRR with normal S1 and S2 and no gallops; no murmurs; no rub  Abdomen: Normoactive bowel sounds, soft, nontender, without masses or organomegaly palpable  Extremities: No pretibial edema.  No tremor. Normal strength and motion throughout. See detailed diabetic foot exam below.  Skin: Normal texture and temperature to palpation. No rash noted.   Neuro: MS is good with appropriate affect, pt is alert and Ox3      DATA REVIEWED:  Lab Results  Component Value Date   HGBA1C 7.0 (H) 05/24/2018   HGBA1C 5.6 01/20/2018   HGBA1C 5.1 10/23/2017   Lab Results  Component Value Date   MICROALBUR 7.2 (H) 10/23/2017   LDLCALC 58 05/24/2018   CREATININE 0.95 07/06/2018   Lab Results  Component Value Date   MICRALBCREAT 4.2 10/23/2017    Lab Results  Component Value Date   FRUCTOSAMINE 282 07/06/2018   FRUCTOSAMINE 225 06/26/2017     Lab Results  Component Value  Date   CHOL 127 05/24/2018   HDL 32.80 (L) 05/24/2018   LDLCALC 58 05/24/2018   TRIG 181.0 (H) 05/24/2018   CHOLHDL 4 05/24/2018         ASSESSMENT / PLAN / RECOMMENDATIONS:   1) Type 2 Diabetes Mellitus, optimally controlled, Without complications - Most recent A1c of 7.0 %. Goal A1c < 7.0 %.    Plan:  - Patient is taking Janumet once a day at bedtime, he was advised to take it with supper.  - We will not increase his dose to twice a day at this time. He was advised to take Janumet with supper, and to avoid sugar sweetened beverages or sugar sweetened snacks. -We reviewed the CGM download together, patient was educated on the effects of bedtime snacks and its effects on glucose control overnight.  -Praised patient on lifestyle changes. -Discussed the effects of tight glucose control early on in diagnosis, and the  reduction of microvascular complications over time.  MEDICATIONS: Janumet 50-1000 mg with supper     EDUCATION / INSTRUCTIONS:  BG monitoring instructions: Patient is instructed to check his blood sugars 3 times a day,Before meals and at bedtime.  Call [facility] if: BG persistently < 70 or > 300. . I reviewed the Rule of 15 for the treatment of hypoglycemia in detail  with the patient. Literature supplied.   F/U in 2 months   Signed electronically by: Lyndle Herrlich, MD  [facility]  [facility] [address] [place], [place] [address] Phone: [phone] FAX: [phone]   CC: Todd Palmer, MD [address] [place] [address] Phone: [phone]  Fax: [phone]  Return to Endocrinology clinic as below: No future appointments.

## 2018-07-08 NOTE — Telephone Encounter (Signed)
Patient called re: Does patient need to schedule a lab appointment for A1C test prior to next appointment scheduled on 09/07/18 . Please call patient at ph# [phone] to advise

## 2018-07-08 NOTE — Patient Instructions (Signed)
-   Continue Janumet 50/1000 mg

## 2018-07-12 NOTE — Telephone Encounter (Signed)
Please advise 

## 2018-07-12 NOTE — Telephone Encounter (Signed)
Pt stated that he will wait until visit and have poc A1C done

## 2018-08-27 ENCOUNTER — Telehealth: Payer: Self-pay | Admitting: Internal Medicine

## 2018-08-27 NOTE — Telephone Encounter (Signed)
Left message for patient to call back and reschedule cancelled 09/07/2018 appointment

## 2018-09-07 ENCOUNTER — Ambulatory Visit: Payer: PPO | Admitting: Internal Medicine

## 2018-09-14 ENCOUNTER — Ambulatory Visit: Payer: PPO | Admitting: Internal Medicine

## 2018-10-01 ENCOUNTER — Ambulatory Visit (INDEPENDENT_AMBULATORY_CARE_PROVIDER_SITE_OTHER): Payer: PPO | Admitting: Internal Medicine

## 2018-10-01 ENCOUNTER — Telehealth: Payer: Self-pay | Admitting: Internal Medicine

## 2018-10-01 ENCOUNTER — Encounter: Payer: Self-pay | Admitting: Internal Medicine

## 2018-10-01 VITALS — BP 132/70 | HR 79 | Ht 67.0 in | Wt 197.0 lb

## 2018-10-01 DIAGNOSIS — E119 Type 2 diabetes mellitus without complications: Secondary | ICD-10-CM

## 2018-10-01 LAB — BASIC METABOLIC PANEL
BUN: 14 mg/dL (ref 6–23)
CO2: 30 mEq/L (ref 19–32)
Calcium: 9.4 mg/dL (ref 8.4–10.5)
Chloride: 105 mEq/L (ref 96–112)
Creatinine, Ser: 1.04 mg/dL (ref 0.40–1.50)
GFR: 86.06 mL/min (ref >60.00)
Glucose, Bld: 89 mg/dL (ref 70–99)
Potassium: 4.5 mEq/L (ref 3.5–5.1)
Sodium: 141 mEq/L (ref 135–145)

## 2018-10-01 LAB — LIPID PANEL
Cholesterol: 134 mg/dL (ref 0–200)
HDL: 34.7 mg/dL — ABNORMAL LOW (ref >39.00)
LDL Cholesterol: 83 mg/dL (ref 0–99)
NonHDL: 99.17
Total CHOL/HDL Ratio: 4
Triglycerides: 81 mg/dL (ref 0.0–149.0)
VLDL: 16.2 mg/dL (ref 0.0–40.0)

## 2018-10-01 LAB — MICROALBUMIN / CREATININE URINE RATIO
Creatinine,U: 254.2 mg/dL
Microalb Creat Ratio: 6.3 mg/g (ref 0.0–30.0)
Microalb, Ur: 16 mg/dL — ABNORMAL HIGH (ref 0.0–1.9)

## 2018-10-01 LAB — POCT GLYCOSYLATED HEMOGLOBIN (HGB A1C): Hemoglobin A1C: 5.7 % — AB (ref 4.0–5.6)

## 2018-10-01 MED ORDER — METFORMIN HCL ER 500 MG PO TB24: 500.0000 mg | ORAL_TABLET | Freq: Every day | ORAL | 11 refills | Status: DC

## 2018-10-01 NOTE — Progress Notes (Signed)
Name: Rynell Ciotti  Age/ Sex: 68 y.o., male   MRN/ DOB: [id number], 04/30/1951     PCP: [name], MD   Reason for Endocrinology Evaluation: Type 2 Diabetes Mellitus  Initial Endocrine Consultative Visit: 05/28/2017    PATIENT IDENTIFIER: Mr. Deno Sida is a 68 y.o. male with a past medical history of HTN, PE and T2DM. The patient has followed with Endocrinology clinic since 2018 for consultative assistance with management of his diabetes.  DIABETIC HISTORY:  Mr. Hannold was diagnosed with T2DM in August, 2018 when he was admitted for hyperosmolar hyperglycemia with a BG > 1000 mg/dL. At the time of discharge he was on insulin and subsequently was able to stop insulin. His hemoglobin A1c has ranged from 5.1%in  10/2017, peaking at 14.5% in 03/2017.   SUBJECTIVE:   During the last visit (05/27/18 ): A1c 7.0% He was continued on Janumet   Today (10/01/2018): Mr. Lesiak is here for his 76-month follow-up on diabetes management. He checks his blood sugars multiple times a day through freestyle libre. The patient has had hypoglycemic episodes since the last clinic visit.  Which has been occurring daily, patient is symptomatic during these episodes, his symptoms are relieved by eating a carbohydrate equivalent of 15 grams. Otherwise, the patient has not required any recent emergency interventions for hypoglycemia and has not had recent hospitalizations secondary to hyper or hypoglycemic episodes.   He continues to exercising.   He is c/o right ear sound for the past ear, as if wind blowing through his ear. No pain per se, and no discharge     ROS: As per HPI and as detailed below: Review of Systems  Constitutional: Negative for malaise/fatigue and weight loss.  HENT: Positive for ear pain. Negative for congestion and sore throat.   Eyes: Negative for blurred vision and pain.  Respiratory: Negative  for cough and shortness of breath.   Cardiovascular: Negative for chest pain and palpitations.  Gastrointestinal: Negative for diarrhea and nausea.    Statin: YEs ACE-I/ARB: Yes   HOME DIABETES REGIMEN:  Janumet 50/1000 mg daily     DIABETIC COMPLICATIONS: Microvascular complications:    Denies: neuropathy, retinopathy, ckd  Last Eye Exam: Completed 10/2017  Macrovascular complications:    Denies: CAD, CVA, PVD     CONTINUOUS GLUCOSE MONITORING RECORD INTERPRETATION    Dates of Recording: 1/25-10/01/2018  Sensor description:Freestyle Josephine Igo  Results statistics:   CGM use % of time 83  Average and SD 87/26  Time in range   72     %  % Time Above 180 0  % Time above 250 0  % Time Below target 28    Glycemic patterns summary: hypoglycemia post-prandial  Hyperglycemic episodes  none  Hypoglycemic episodes occurred after meals   Overnight periods: no hypoglycemia     HISTORY:  Past Medical History:  Past Medical History:  Diagnosis Date  . Acute pulmonary embolism (HCC) 04/08/2017  . Altered mental status 03/28/2017   history of, for 3 days  . Atelectasis 04/08/2017   mild right basilar noted on CT chest  . Bladder calculi 03/29/2017   Large noted on US renal  . Bladder stones   . CHF (congestive heart failure) (HCC)    per ECHO  . DKA (diabetic ketoacidoses) (HCC) 03/28/2017   HHS/DKA; hospitalized for 3 days/notes 04/08/2017  . DVT (deep venous thrombosis) (HCC)    Unsure if this is superficial thrombophlebitis, happened in ~2010 in [place]  . Dyspnea 03/2017  per ECHO  . History of kidney stones   . HTN (hypertension)   . Mass 2004   Left side of chest, massive infection per CT  . Nonischemic cardiomyopathy (HCC)    per ECHO  . Pneumonia 2011  . Pulmonary infarct (HCC) 04/08/2017   left lower lung lobe noted on CT of chest  . Type II diabetes mellitus (HCC)    "dx'd 03/28/2017"   Past Surgical History:  Past Surgical History:  Procedure  Laterality Date  . COLONOSCOPY    . CYSTOSCOPY WITH LITHOLAPAXY N/A 09/07/2017   Procedure: CYSTOSCOPY WITH LITHOLAPAXY;  Surgeon: Ihor Gullyttelin, Mark, MD;  Location: [facility];  Service: Urology;  Laterality: N/A;  . TONSILLECTOMY  1960    Social History:  reports that he quit smoking about 3 years ago. His smoking use included cigarettes. He has a 4.80 pack-year smoking history. He has never used smokeless tobacco. He reports current alcohol use. He reports that he does not use drugs. Family History:  Family History  Problem Relation Age of Onset  . Heart failure Father   . Diabetes Sister      HOME MEDICATIONS: Allergies as of 10/01/2018      Reactions   Amlodipine Other (See Comments)   "Feet Swelling"   Penicillins Other (See Comments)   Unknown Has patient had a PCN reaction causing immediate rash, facial/tongue/throat swelling, SOB or lightheadedness with hypotension: Unknown Has patient had a PCN reaction causing severe rash involving mucus membranes or skin necrosis: Unknown Has patient had a PCN reaction that required hospitalization: Unknown Has patient had a PCN reaction occurring within the last 10 years: No If all of the above answers are "NO", then may proceed with Cephalosporin use.      Medication List       Accurate as of October 01, 2018  4:20 PM. Always use your most recent med list.        FREESTYLE LIBRE 14 DAY SENSOR Misc APPLY 1 UNIT EVERY 14 (FOURTEEN) DAYS.   losartan-hydrochlorothiazide 100-12.5 MG tablet Commonly known as:  HYZAAR Take by mouth daily. Taking 1/2 tablet daily   metFORMIN 500 MG 24 hr tablet Commonly known as:  GLUCOPHAGE-XR Take 1 tablet (500 mg total) by mouth daily with supper.        OBJECTIVE:   Vital Signs: BP 132/70 (BP Location: Left Arm, Patient Position: Sitting, Cuff Size: Normal)   Pulse 79   Ht 5\' 7"  (1.702 m)   Wt 197 lb (89.4 kg)   SpO2 97%   BMI 30.85 kg/m   Wt Readings from Last 3 Encounters:  10/01/18 197  lb (89.4 kg)  07/08/18 189 lb (85.7 kg)  05/27/18 192 lb (87.1 kg)     Exam: General: Pt appears well and is in NAD  HEENT Right ear cana cerumen   Lungs: Clear with good BS bilat with no rales, rhonchi, or wheezes  Heart: RRR with normal S1 and S2 and no gallops; no murmurs; no rub  Abdomen: Normoactive bowel sounds, soft, nontender, without masses or organomegaly palpable  Extremities: No pretibial edema. No tremor. Normal strength and motion throughout. See detailed diabetic foot exam below.  Skin: Normal texture and temperature to palpation. No rash noted.   Neuro: MS is good with appropriate affect, pt is alert and Ox3      DATA REVIEWED:  Lab Results  Component Value Date   HGBA1C 5.7 (A) 10/01/2018   HGBA1C 7.0 (H) 05/24/2018   HGBA1C 5.6 01/20/2018  Lab Results  Component Value Date   MICROALBUR 16.0 (H) 10/01/2018   LDLCALC 83 10/01/2018   CREATININE 1.04 10/01/2018   Lab Results  Component Value Date   MICRALBCREAT 6.3 10/01/2018     Lab Results  Component Value Date   CHOL 134 10/01/2018   HDL 34.70 (L) 10/01/2018   LDLCALC 83 10/01/2018   TRIG 81.0 10/01/2018   CHOLHDL 4 10/01/2018       Results for EMRYK, CAULDER (MRN [id number]) as of 10/01/2018 16:24  Ref. Range 10/01/2018 08:11  Sodium Latest Ref Range: 135 - 145 mEq/L 141  Potassium Latest Ref Range: 3.5 - 5.1 mEq/L 4.5  Chloride Latest Ref Range: 96 - 112 mEq/L 105  CO2 Latest Ref Range: 19 - 32 mEq/L 30  Glucose Latest Ref Range: 70 - 99 mg/dL 89  BUN Latest Ref Range: 6 - 23 mg/dL 14  Creatinine Latest Ref Range: 0.40 - 1.50 mg/dL 2.48  Calcium Latest Ref Range: 8.4 - 10.5 mg/dL 9.4  GFR Latest Ref Range: >60.00 mL/min 86.06  Total CHOL/HDL Ratio Unknown 4  Cholesterol Latest Ref Range: 0 - 200 mg/dL 250  HDL Cholesterol Latest Ref Range: >39.00 mg/dL 03.70 (L)  LDL (calc) Latest Ref Range: 0 - 99 mg/dL 83  MICROALB/CREAT RATIO Latest Ref Range: 0.0 - 30.0 mg/g 6.3  NonHDL Unknown  99.17  Triglycerides Latest Ref Range: 0.0 - 149.0 mg/dL 48.8  VLDL Latest Ref Range: 0.0 - 40.0 mg/dL 89.1  Creatinine,U Latest Units: mg/dL 694.5  Microalb, Ur Latest Ref Range: 0.0 - 1.9 mg/dL 03.8 (H)    ASSESSMENT / PLAN / RECOMMENDATIONS:   1) Type 2 Diabetes Mellitus, optimally controlled, Without complications - Most recent A1c of 5.7 %. Goal A1c < 7.0 %.    Plan: -Praised patient on lifestyle changes, unfortunately is having multiple hypoglycemic episodes, these were noted during the day. -He was encouraged to continue with lifestyle changes and at regular exercise -We will stop his Janumet and start him on metformin, he was advised to call us if his sugars are persistently over 150 mg/dL -In the meantime he can use half a tablet of Janumet with supper daily, as he just picked it up a new prescription,  but he understands that if hypoglycemia continues to occur  then he will need to stop this and start the metformin   MEDICATIONS: Stop Janumet  Start metformin XR 500 mg with supper   EDUCATION / INSTRUCTIONS:  BG monitoring instructions: Patient is instructed to check his blood sugars 3 times a day,Before meals and at bedtime.  Call [facility] if: BG persistently < 70 or > 300. . I reviewed the Rule of 15 for the treatment of hypoglycemia in detail with the patient. Literature supplied.  2) Diabetic complications:   Eye: Does not have known diabetic retinopathy.   Neuro/ Feet: Does not have known diabetic peripheral neuropathy .   Renal: Patient does not have known baseline CKD.  Today's microalb/cr normal.  He is on an ACEI/ARB at present.  3). Right Ear Cerumen  : -Patient advised that I am unable to clean his ear due to lack of instruments He was advised to either use OTC medications to clean his right ear will call his PCP for cerumen removal.  4) Lipid: -His LDL is below 100 mg/dL.  He is not on a statin  F/U in 3 months   ight Signed  electronically by: Lyndle Herrlich, MD  [facility]  [facility] [address] [place], [place] [address] Phone: [phone] FAX: [phone]   CC: Mila PalmerWolters, Sharon, MD [address] [place] [address] Phone: [phone]  Fax: [phone]  Return to Endocrinology clinic as below: Future Appointments  Date Time Provider Department Center  12/30/2018  7:30 AM Devri Kreher, Konrad DoloresIbtehal Jaralla, MD [id number] None

## 2018-10-01 NOTE — Telephone Encounter (Signed)
Clarified with pharmacy that pt is to finish janumet rx and then start metformin

## 2018-10-01 NOTE — Patient Instructions (Signed)
-   Take Janumet half a tablet with supper until you are done with the prescription or if your develop low sugars (less then 70 mg/dL) - When you are finished with the Janumet prescription, please start Metfromin XR 500 mg with supper daily  - HOW TO TREAT LOW BLOOD SUGARS (Blood sugar LESS THAN 70 MG/DL)  Please follow the RULE OF 15 for the treatment of hypoglycemia treatment (when your (blood sugars are less than 70 mg/dL)    STEP 1: Take 15 grams of carbohydrates when your blood sugar is low, which includes:   3-4 GLUCOSE TABS  OR  3-4 OZ OF JUICE OR REGULAR SODA OR  ONE TUBE OF GLUCOSE GEL     STEP 2: RECHECK blood sugar in 15 MINUTES STEP 3: If your blood sugar is still low at the 15 minute recheck --> then, go back to STEP 1 and treat AGAIN with another 15 grams of carbohydrates.

## 2018-10-01 NOTE — Telephone Encounter (Signed)
[id number] [place], [place] - [address] [place] [phone] (Phone) [phone] (Fax)   Called re: Pharmacy requesting more information re: the medication metFORMIN (GLUCOPHAGE-XR) 500 MG 24 hr tablet. Please call at the phone# listed above to advise.

## 2018-10-02 IMAGING — US US RENAL
1 series · 14 of 25 positions shown · non-contrast
Comparison: None.

CLINICAL DATA: Acute kidney injury

EXAM:
RENAL / URINARY TRACT ULTRASOUND COMPLETE

[Series 1: us renal · 0.26mm/px · 14 of 34 slices shown]
[im 1/34]
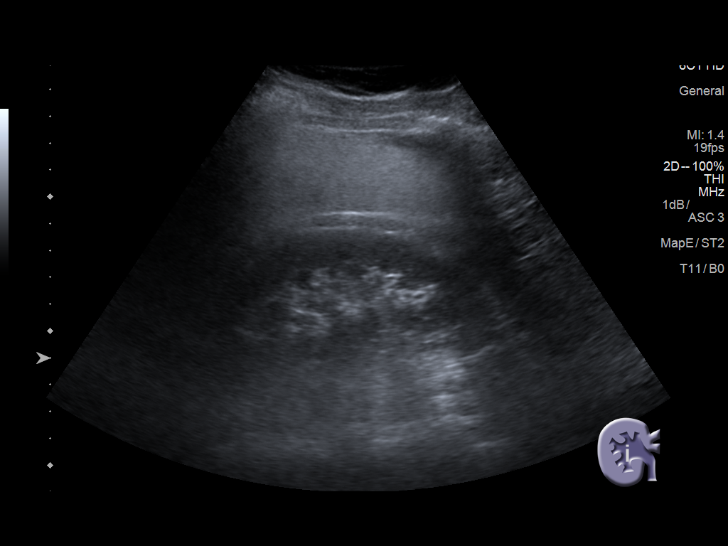
[im 3/34]
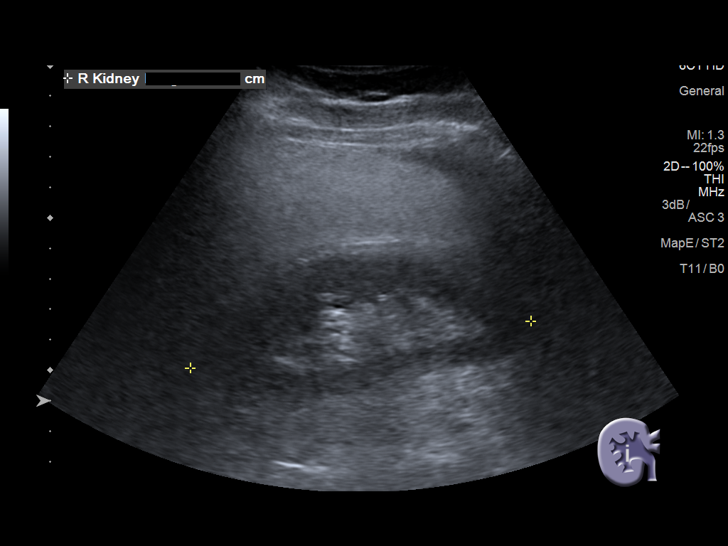
[im 6/34]
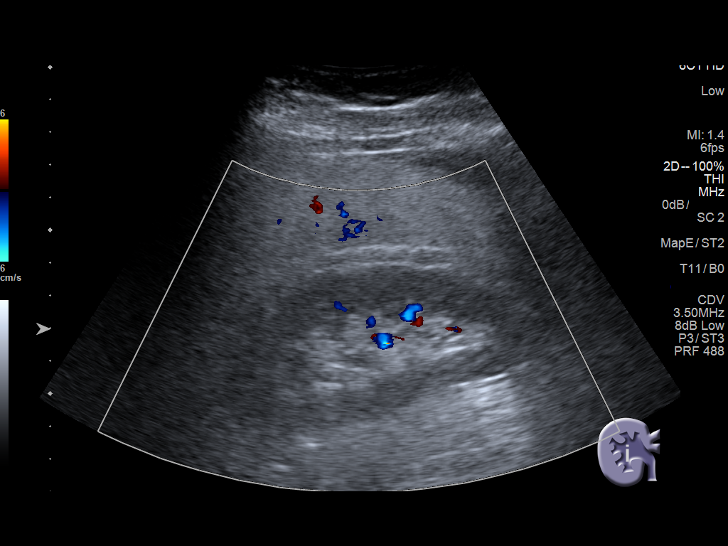
[im 9/34]
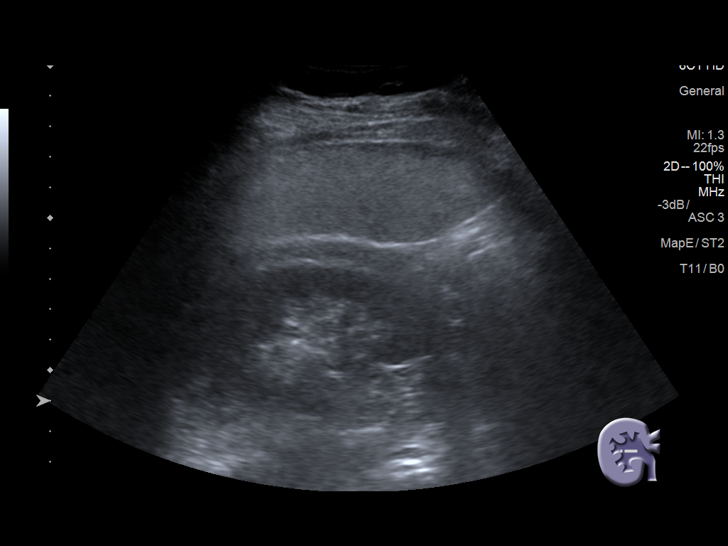
[im 12/34]
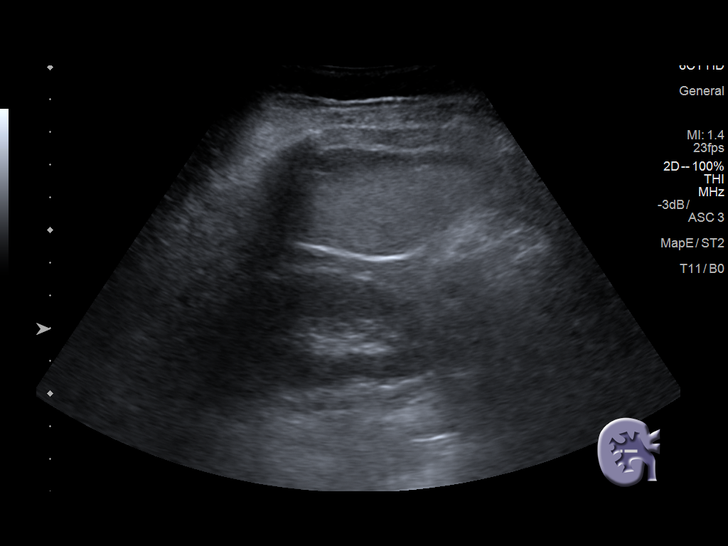
[im 13/34]
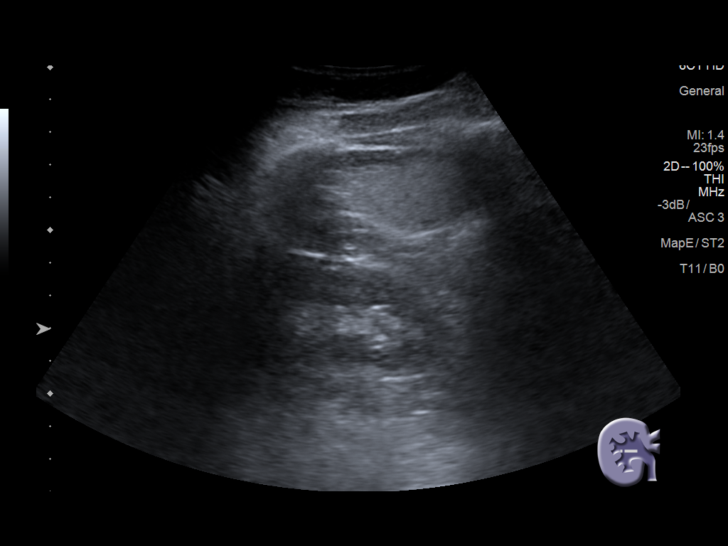
[im 16/34]
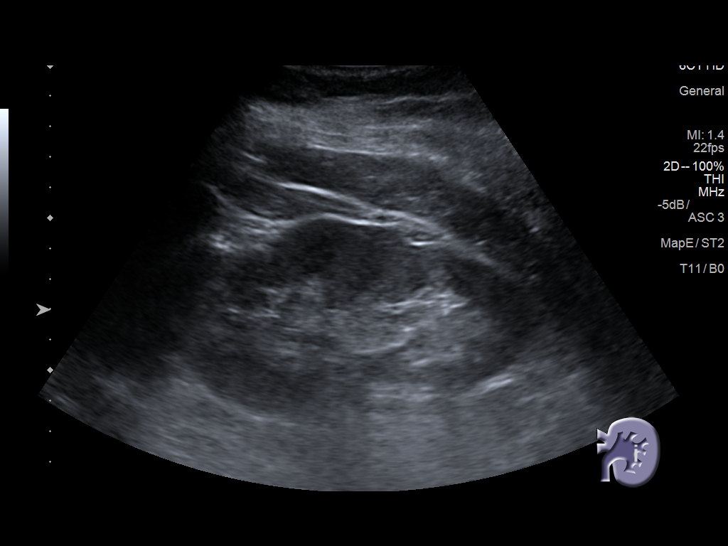
[im 18/34]
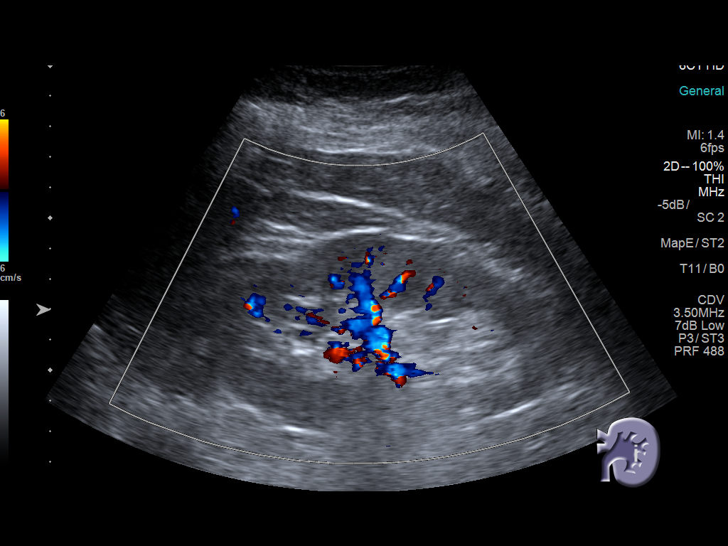
[im 21/34]
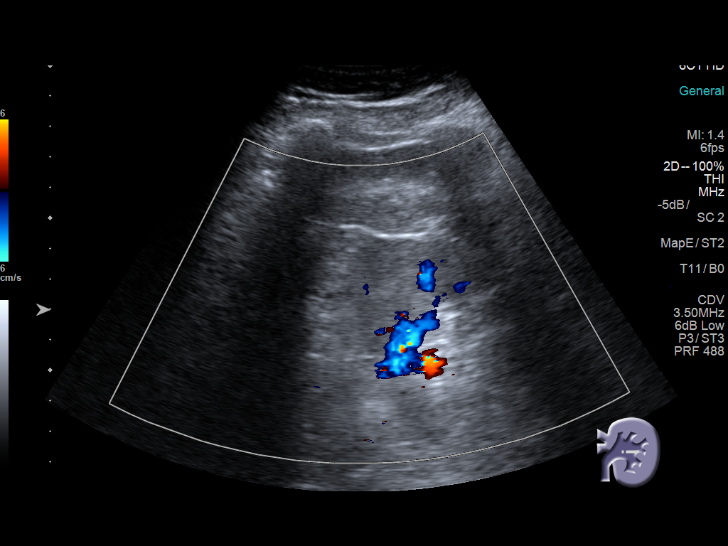
[im 23/34]
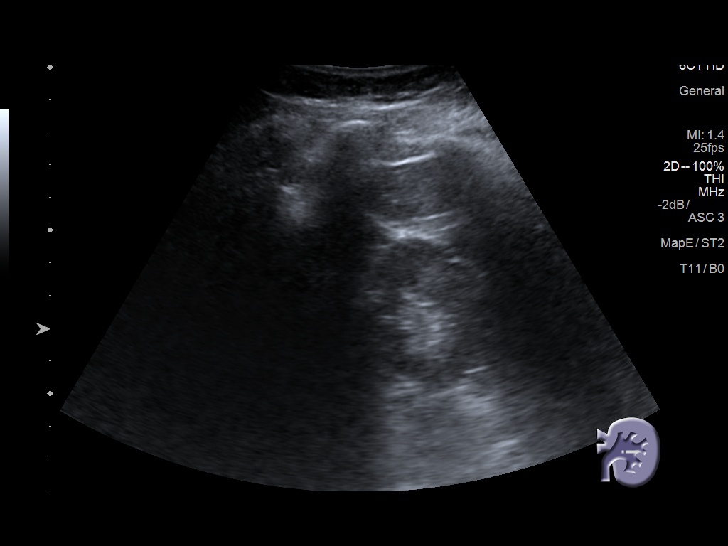
[im 25/34]
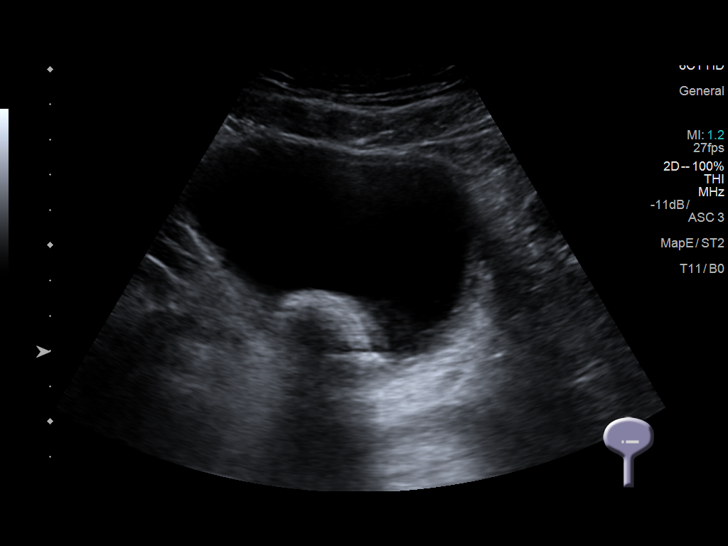
[im 28/34]
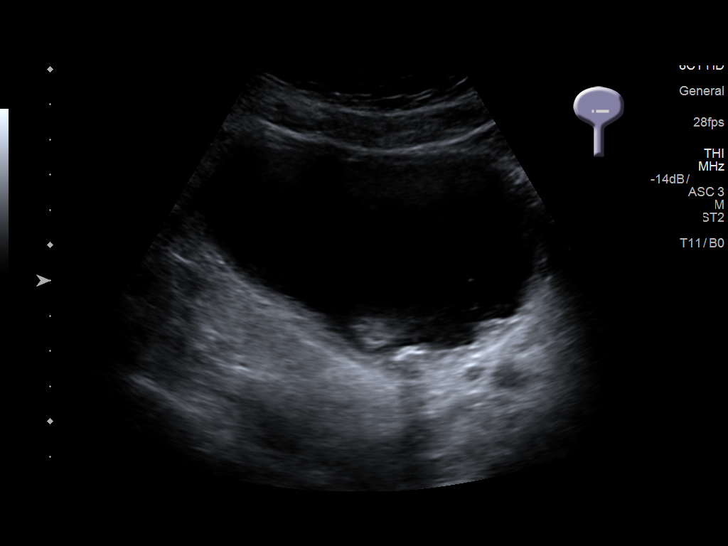
[im 31/34]
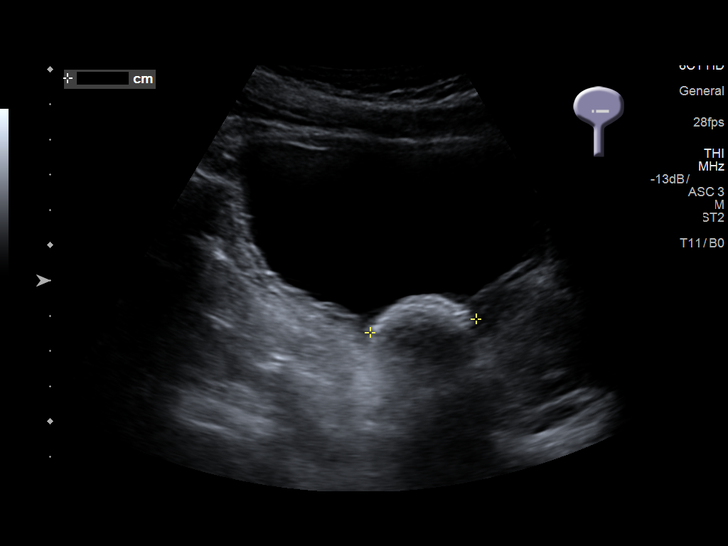
[im 34/34]
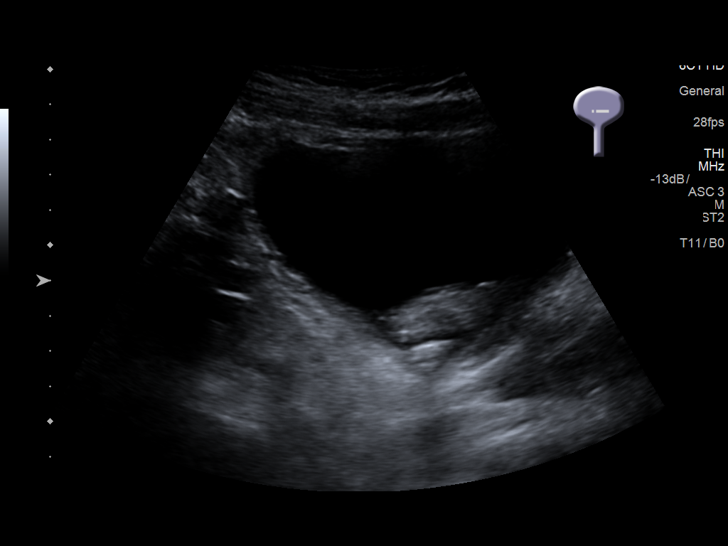

[14 of 25 positions shown; findings below may reference images not displayed]

FINDINGS: Right Kidney:

Length: 11.3 cm. Echogenicity within normal limits. No mass or
hydronephrosis visualized.

Left Kidney:

Length: 12.2 cm. Echogenicity within normal limits. No mass or
hydronephrosis visualized.

Bladder:

Large 3 cm ovoid lesion in the lumen of the bladder with posterior
shadowing is most consistent with large bladder calculus. Second
smaller 1 cm calculus.
IMPRESSION: 1. Normal kidneys.
2. Large bladder calculi.

## 2018-10-05 ENCOUNTER — Ambulatory Visit: Payer: PPO | Admitting: Internal Medicine

## 2018-10-21 ENCOUNTER — Other Ambulatory Visit: Payer: Self-pay | Admitting: Endocrinology

## 2018-10-21 ENCOUNTER — Telehealth: Payer: Self-pay

## 2018-10-21 NOTE — Telephone Encounter (Signed)
Error

## 2018-10-21 NOTE — Telephone Encounter (Signed)
Patient has switched to Dr. Lonzo Cloud

## 2018-11-09 ENCOUNTER — Other Ambulatory Visit: Payer: Self-pay

## 2018-11-09 ENCOUNTER — Telehealth: Payer: Self-pay | Admitting: Internal Medicine

## 2018-11-09 MED ORDER — FREESTYLE LIBRE 14 DAY SENSOR MISC: 1.0000 "application " | 4 refills | Status: DC

## 2018-11-09 NOTE — Telephone Encounter (Signed)
sent 

## 2018-11-09 NOTE — Telephone Encounter (Signed)
MEDICATION: Continuous Blood Gluc Sensor (FREESTYLE LIBRE 14 DAY SENSOR) MISC  PHARMACY:  [id number] - [place], [place] - [address]  IS THIS A 90 DAY SUPPLY :   IS PATIENT OUT OF MEDICATION: yes  IF NOT; HOW MUCH IS LEFT:   LAST APPOINTMENT DATE: @2 /27/2020  NEXT APPOINTMENT DATE:@5 /02/2019  DO WE HAVE YOUR PERMISSION TO LEAVE A DETAILED MESSAGE:  OTHER COMMENTS:    **Let patient know to contact pharmacy at the end of the day to make sure medication is ready. **  ** Please notify patient to allow 48-72 hours to process**  **Encourage patient to contact the pharmacy for refills or they can request refills through [facility]**

## 2018-12-29 ENCOUNTER — Telehealth: Payer: Self-pay | Admitting: Internal Medicine

## 2018-12-29 NOTE — Telephone Encounter (Signed)
Patient is returning Tashia's call. °

## 2018-12-30 ENCOUNTER — Ambulatory Visit (INDEPENDENT_AMBULATORY_CARE_PROVIDER_SITE_OTHER): Payer: PPO | Admitting: Internal Medicine

## 2018-12-30 ENCOUNTER — Encounter: Payer: Self-pay | Admitting: Internal Medicine

## 2018-12-30 DIAGNOSIS — E119 Type 2 diabetes mellitus without complications: Secondary | ICD-10-CM

## 2018-12-30 MED ORDER — METFORMIN HCL ER 500 MG PO TB24: 1000.0000 mg | ORAL_TABLET | Freq: Every day | ORAL | 3 refills | Status: DC

## 2018-12-30 NOTE — Progress Notes (Addendum)
Virtual Visit via Video Note  I connected with Todd Avila on 12/30/18 at 7:30 AM by a video enabled telemedicine application and verified that I am speaking with the correct person using two identifiers.   I discussed the limitations of evaluation and management by telemedicine and the availability of in person appointments. The patient expressed understanding and agreed to proceed.  -Location of the patient :Home  -Location of the provider : Office -The names of all persons participating in the telemedicine service : Pt and myself        Name: Todd Avila  Age/ Sex: 68 y.o., male   MRN/ DOB: [id number], 1950-11-26     PCP: [name], MD   Reason for Endocrinology Evaluation: Type 2 Diabetes Mellitus  Initial Endocrine Consultative Visit: 05/28/2017    PATIENT IDENTIFIER: Todd Avila is a 68 y.o. male with a past medical history of HTN, PE and T2DM. The patient has followed with Endocrinology clinic since 2018 for consultative assistance with management of his diabetes.  DIABETIC HISTORY:  Mr. Ehrman was diagnosed with T2DM in August, 2018 when he was admitted for hyperosmolar hyperglycemia with a BG > 1000 mg/dL. At the time of discharge he was on insulin and subsequently was able to stop insulin. His hemoglobin A1c has ranged from 5.1%in  10/2017, peaking at 14.5% in 03/2017.  Januvia stopped and Metformin started 12/2018 with an A1c of 5.7% and daily hypoglycemic episodes.    SUBJECTIVE:   During the last visit (05/27/18 ): A1c 5.7%. Janumet was discontinued and was started on Metformin.   Today (12/30/2018): Mr. [name] is here for a virtual 48-month follow-up on diabetes management. He checks his blood sugars multiple times a day through freestyle libre. The patient has not had hypoglycemic episodes since the last clinic visit.  Which haas been  occurring daily in the past.He admits to decrease in exercise and eating more snacks since the shelter at home  orders.  Otherwise, the patient has not required any recent emergency interventions for hypoglycemia and has not had recent hospitalizations secondary to hyper or hypoglycemic episodes.     ROS: As per HPI and as detailed below: Review of Systems  Constitutional: Negative for malaise/fatigue and weight loss.  HENT: Negative for congestion and sore throat.   Eyes: Negative for blurred vision and pain.  Respiratory: Negative for cough and shortness of breath.   Cardiovascular: Negative for chest pain and palpitations.  Gastrointestinal: Negative for diarrhea and nausea.    Statin: No ACE-I/ARB: Yes   HOME DIABETES REGIMEN:  Janumet 50/1000 mg Half tablet daily with supper , has not started Metformin yet     DIABETIC COMPLICATIONS: Microvascular complications:    Denies: neuropathy, retinopathy, ckd  Last Eye Exam: Completed 10/2017  Macrovascular complications:    Denies: CAD, CVA, PVD     CONTINUOUS GLUCOSE MONITORING RECORD INTERPRETATION    Dates of Recording: 4/23-5/6/202  Sensor description:Freestyle Libre  Results statistics:   CGM use % of time 67  Average and SD 128/29.8  Time in range  86 %  % Time Above 180 11  % Time above 250 0  % Time Below target 3    Glycemic patterns summary: Hyperglycemia after breakfast , rest of the day is at goal Hyperglycemic episodes after breakfast   Hypoglycemic episodes occurred none  Overnight periods: no hypoglycemia      HISTORY:  Past Medical History:  Past Medical History:  Diagnosis Date  . Acute pulmonary embolism (HCC) 04/08/2017  .  Altered mental status 03/28/2017   history of, for 3 days  . Atelectasis 04/08/2017   mild right basilar noted on CT chest  . Bladder calculi 03/29/2017   Large noted on US renal  . Bladder stones   . CHF (congestive heart failure) (HCC)    per ECHO  . DKA (diabetic ketoacidoses) (HCC) 03/28/2017   HHS/DKA; hospitalized for 3 days/notes 04/08/2017  . DVT (deep  venous thrombosis) (HCC)    Unsure if this is superficial thrombophlebitis, happened in ~2010 in [place]  . Dyspnea 03/2017   per ECHO  . History of kidney stones   . HTN (hypertension)   . Mass 2004   Left side of chest, massive infection per CT  . Nonischemic cardiomyopathy (HCC)    per ECHO  . Pneumonia 2011  . Pulmonary infarct (HCC) 04/08/2017   left lower lung lobe noted on CT of chest  . Type II diabetes mellitus (HCC)    "dx'd 03/28/2017"   Past Surgical History:  Past Surgical History:  Procedure Laterality Date  . COLONOSCOPY    . CYSTOSCOPY WITH LITHOLAPAXY N/A 09/07/2017   Procedure: CYSTOSCOPY WITH LITHOLAPAXY;  Surgeon: Ihor Gully, MD;  Location: [facility];  Service: Urology;  Laterality: N/A;  . TONSILLECTOMY  1960    Social History:  reports that he quit smoking about 4 years ago. His smoking use included cigarettes. He has a 4.80 pack-year smoking history. He has never used smokeless tobacco. He reports current alcohol use. He reports that he does not use drugs. Family History:  Family History  Problem Relation Age of Onset  . Heart failure Father   . Diabetes Sister      HOME MEDICATIONS: Allergies as of 12/30/2018      Reactions   Amlodipine Other (See Comments)   "Feet Swelling"   Penicillins Other (See Comments)   Unknown Has patient had a PCN reaction causing immediate rash, facial/tongue/throat swelling, SOB or lightheadedness with hypotension: Unknown Has patient had a PCN reaction causing severe rash involving mucus membranes or skin necrosis: Unknown Has patient had a PCN reaction that required hospitalization: Unknown Has patient had a PCN reaction occurring within the last 10 years: No If all of the above answers are "NO", then may proceed with Cephalosporin use.      Medication List       Accurate as of Dec 30, 2018  9:30 AM. If you have any questions, ask your nurse or doctor.        FreeStyle Libre 14 Day Sensor Misc 1 application by Other  route every 14 (fourteen) days.   losartan-hydrochlorothiazide 100-12.5 MG tablet Commonly known as:  HYZAAR Take by mouth daily. Taking 1/2 tablet daily   metFORMIN 500 MG 24 hr tablet Commonly known as:  GLUCOPHAGE-XR Take 1 tablet (500 mg total) by mouth daily with supper.        DATA REVIEWED:  Lab Results  Component Value Date   HGBA1C 5.7 (A) 10/01/2018   HGBA1C 7.0 (H) 05/24/2018   HGBA1C 5.6 01/20/2018   Lab Results  Component Value Date   MICROALBUR 16.0 (H) 10/01/2018   LDLCALC 83 10/01/2018   CREATININE 1.04 10/01/2018   Lab Results  Component Value Date   MICRALBCREAT 6.3 10/01/2018     Lab Results  Component Value Date   CHOL 134 10/01/2018   HDL 34.70 (L) 10/01/2018   LDLCALC 83 10/01/2018   TRIG 81.0 10/01/2018   CHOLHDL 4 10/01/2018  Results for SHAMUS, DESANTIS (MRN [id number]) as of 10/01/2018 16:24  Ref. Range 10/01/2018 08:11  Sodium Latest Ref Range: 135 - 145 mEq/L 141  Potassium Latest Ref Range: 3.5 - 5.1 mEq/L 4.5  Chloride Latest Ref Range: 96 - 112 mEq/L 105  CO2 Latest Ref Range: 19 - 32 mEq/L 30  Glucose Latest Ref Range: 70 - 99 mg/dL 89  BUN Latest Ref Range: 6 - 23 mg/dL 14  Creatinine Latest Ref Range: 0.40 - 1.50 mg/dL 7.82  Calcium Latest Ref Range: 8.4 - 10.5 mg/dL 9.4  GFR Latest Ref Range: >60.00 mL/min 86.06  Total CHOL/HDL Ratio Unknown 4  Cholesterol Latest Ref Range: 0 - 200 mg/dL 956  HDL Cholesterol Latest Ref Range: >39.00 mg/dL 21.30 (L)  LDL (calc) Latest Ref Range: 0 - 99 mg/dL 83  MICROALB/CREAT RATIO Latest Ref Range: 0.0 - 30.0 mg/g 6.3  NonHDL Unknown 99.17  Triglycerides Latest Ref Range: 0.0 - 149.0 mg/dL 86.5  VLDL Latest Ref Range: 0.0 - 40.0 mg/dL 78.4  Creatinine,U Latest Units: mg/dL 696.2  Microalb, Ur Latest Ref Range: 0.0 - 1.9 mg/dL 95.2 (H)    ASSESSMENT / PLAN / RECOMMENDATIONS:   1) Type 2 Diabetes Mellitus, optimally controlled, Without complications - Most recent A1c of 5.7 %. Goal  A1c < 7.0 %.  6.4  % per CGM indicator  Plan: - His BG's continued to look great without hypoglycemia - On his last visit, we stopped Janumet and switched him to metformin due to recurrent hypoglycemia, but since he just got a shipment of Janumet , I have asked him to take half a tablet with supper instead of one tablet until he runs out and he is currently still on the half tablet of Janumet .He is supposed to switch to 1 tablet of Metformin soon but given his average BG's went from 87 to 128 mg/dL which is due to change in recent lifestyle ,  I will increase Metformin to 2  Tablets with supper     MEDICATIONS: Stop Janumet - when he runs out Metformin XR 500 mg 2 tablets  with supper   EDUCATION / INSTRUCTIONS:  BG monitoring instructions: Patient is instructed to check his blood sugars 3 times a day,Before meals and at bedtime.  Call [facility] if: BG persistently < 70 or > 300. . I reviewed the Rule of 15 for the treatment of hypoglycemia in detail with the patient. Literature supplied.  2) Diabetic complications:   Eye: Does not have known diabetic retinopathy.   Neuro/ Feet: Does not have known diabetic peripheral neuropathy .   Renal: Patient does not have known baseline CKD.  Up to date on microalb/cr normal.  He is on an ACEI/ARB at present.  3) Lipid:   -His LDL is below 100 mg/dL.  He is not on a statin. I have discussed cardiovascular benefits with statins, he is going to think about this.    discussed the assessment and treatment plan with the patient. The patient was provided an opportunity to ask questions and all were answered. The patient agreed with the plan and demonstrated an understanding of the instructions.   The patient was advised to call back or seek an in-person evaluation if the symptoms worsen or if the condition fails to improve as anticipated.     F/U in 3 months   Signed electronically by: Lyndle Herrlich,  MD  [facility] End[facility]  [facility] [address] [place] [place],  [place] [address] Phone: [phone] FAX: [phone]   CC: Mila PalmerWolters, Sharon, MD [address] [place] [address] Phone: [phone]  Fax: [phone]  Return to Endocrinology clinic as below: No future appointments.

## 2019-01-04 DIAGNOSIS — Z79899 Other long term (current) drug therapy: Secondary | ICD-10-CM | POA: Diagnosis not present

## 2019-01-04 DIAGNOSIS — N400 Enlarged prostate without lower urinary tract symptoms: Secondary | ICD-10-CM | POA: Diagnosis not present

## 2019-01-04 DIAGNOSIS — Z1159 Encounter for screening for other viral diseases: Secondary | ICD-10-CM | POA: Diagnosis not present

## 2019-01-05 NOTE — Telephone Encounter (Signed)
Pt seen 12/30/18 at 7:30 AM by a video enabled telemedicine application Nothing further needed.

## 2019-01-06 DIAGNOSIS — I2699 Other pulmonary embolism without acute cor pulmonale: Secondary | ICD-10-CM | POA: Diagnosis not present

## 2019-01-06 DIAGNOSIS — I10 Essential (primary) hypertension: Secondary | ICD-10-CM | POA: Diagnosis not present

## 2019-01-06 DIAGNOSIS — Z0000 Encounter for general adult medical examination without abnormal findings: Secondary | ICD-10-CM | POA: Diagnosis not present

## 2019-01-06 DIAGNOSIS — N400 Enlarged prostate without lower urinary tract symptoms: Secondary | ICD-10-CM | POA: Diagnosis not present

## 2019-03-28 ENCOUNTER — Other Ambulatory Visit: Payer: Self-pay

## 2019-03-28 MED ORDER — FREESTYLE LIBRE 14 DAY SENSOR MISC: 1.0000 "application " | 4 refills | Status: DC

## 2019-04-05 ENCOUNTER — Ambulatory Visit: Payer: PPO | Admitting: Internal Medicine

## 2019-04-24 IMAGING — CT CT ANGIO CHEST
2 of 6 series · 18 of 36 positions shown · IV contrast (Omni 300)
Comparison: Chest radiograph performed earlier today at [DATE] a.m.

CLINICAL DATA: Acute onset of left lateral muscle spasms and
orthopnea. Initial encounter.

EXAM:
CT ANGIOGRAPHY CHEST WITH CONTRAST
TECHNIQUE: Multidetector CT imaging of the chest was performed using the
standard protocol during bolus administration of intravenous
contrast. Multiplanar CT image reconstructions and MIPs were
obtained to evaluate the vascular anatomy.
CONTRAST:  51 mL of Isovue 370 IV contrast

[Series 7: pe 2mm cor · coronal · 0.44mm/px · 1 of 116 slices shown]
[im 58/116  mediastinal]
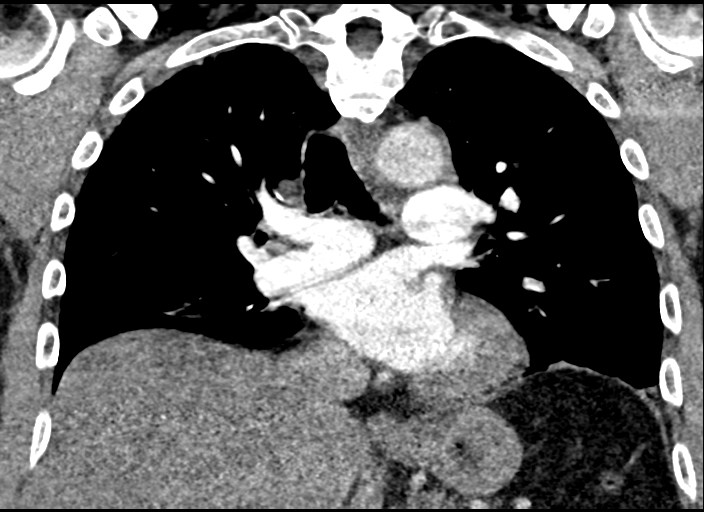

[Series 8: pe thins · axial · 0.90mm/px · z∈[+1204,+1402]mm · 17 of 224 slices shown]
[im 13/224  lung]
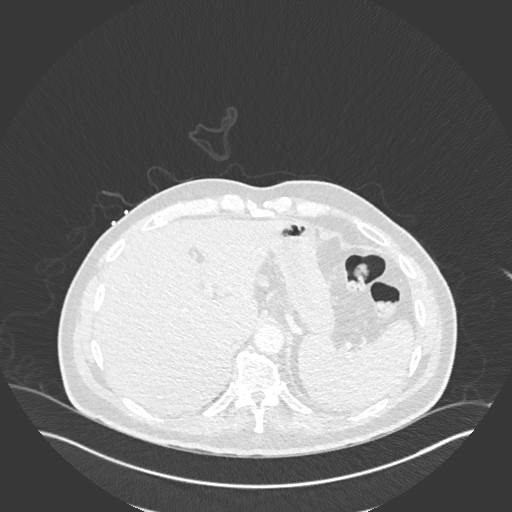
[im 25/224  mediastinal]
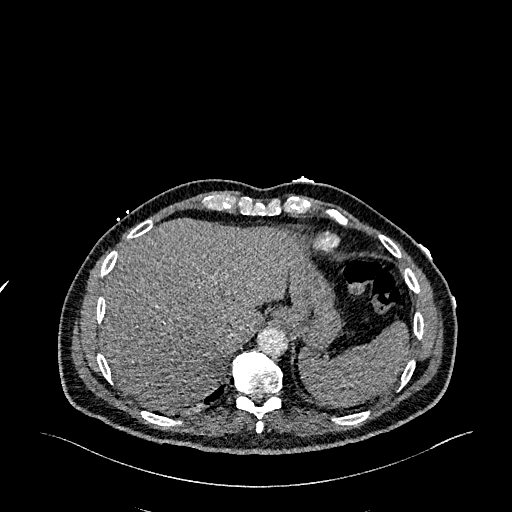
[im 38/224  lung]
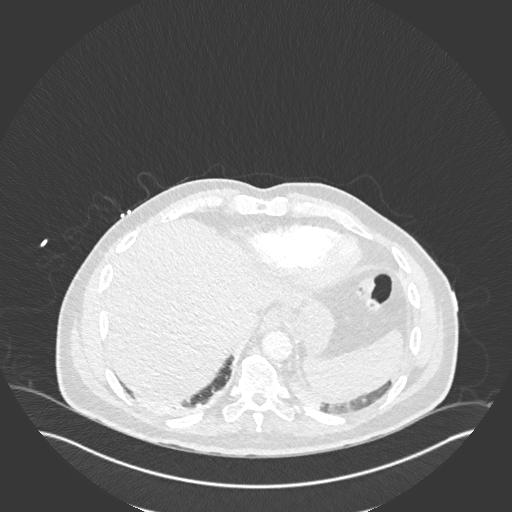
[im 50/224  mediastinal]
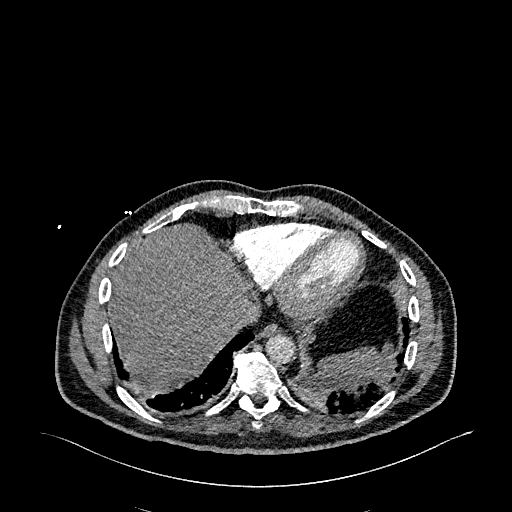
[im 62/224  lung]
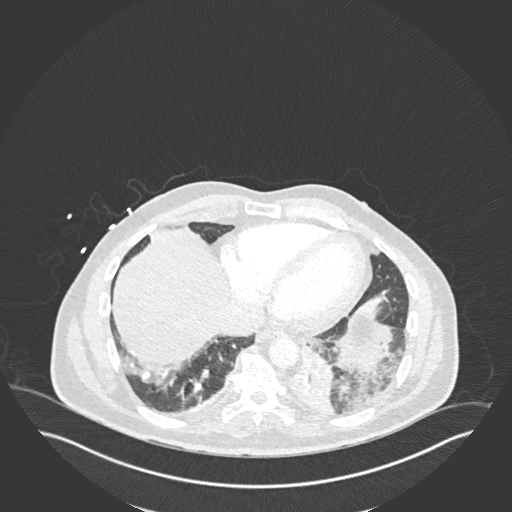
[im 75/224  mediastinal]
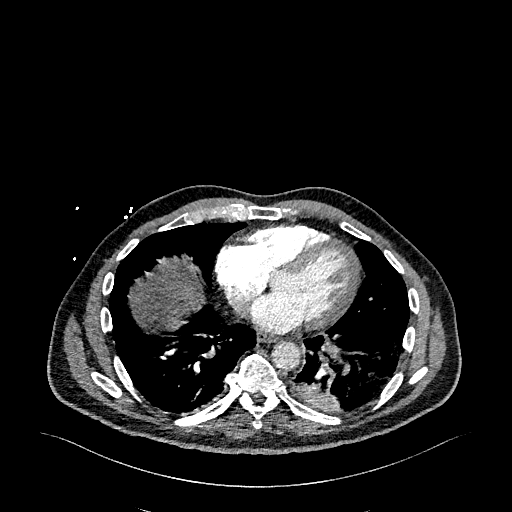
[im 87/224  lung]
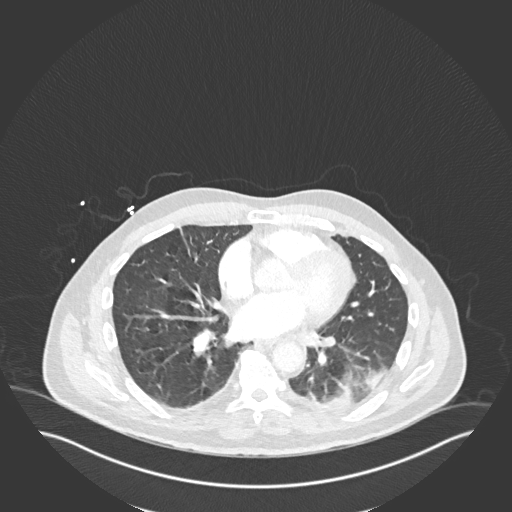
[im 100/224  mediastinal]
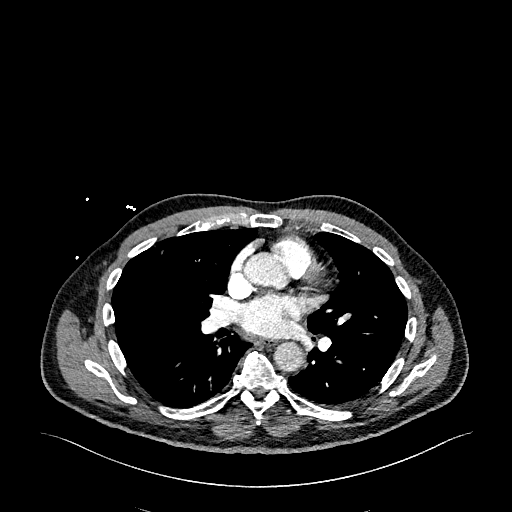
[im 112/224  lung]
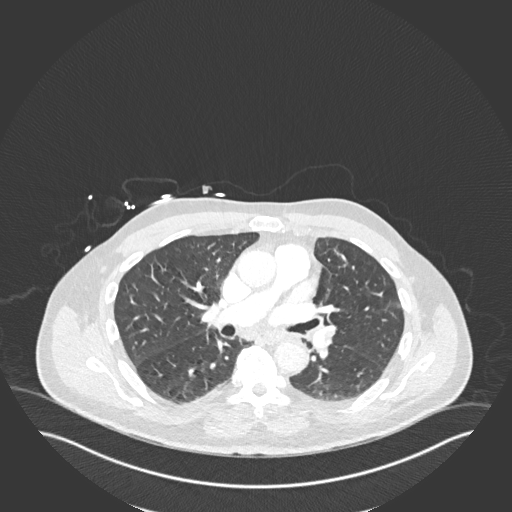
[im 124/224  mediastinal]
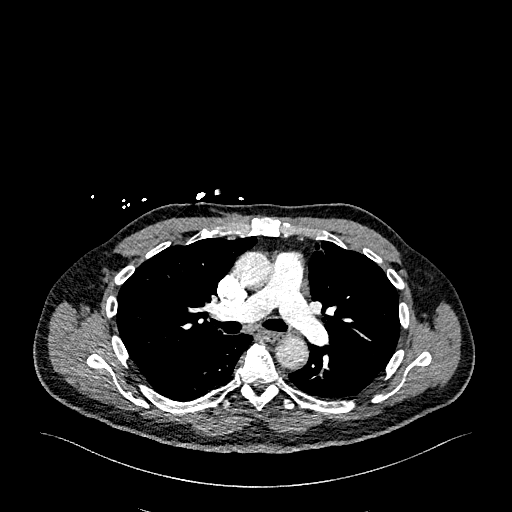
[im 137/224  lung]
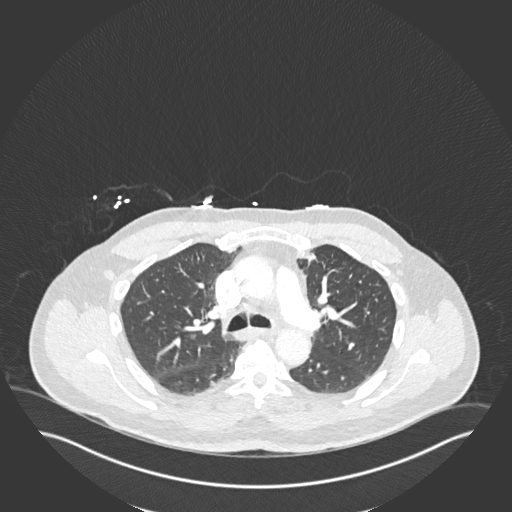
[im 149/224  mediastinal]
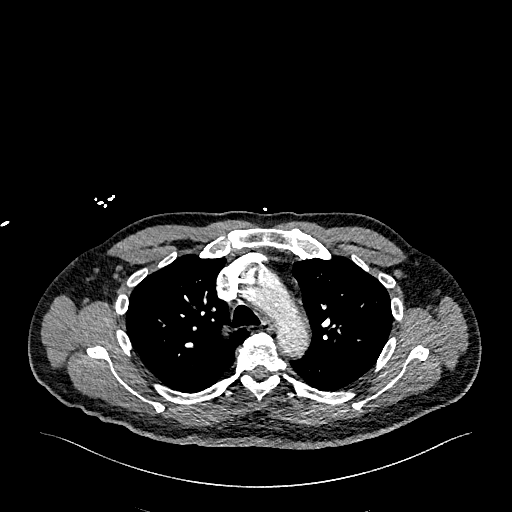
[im 162/224  lung]
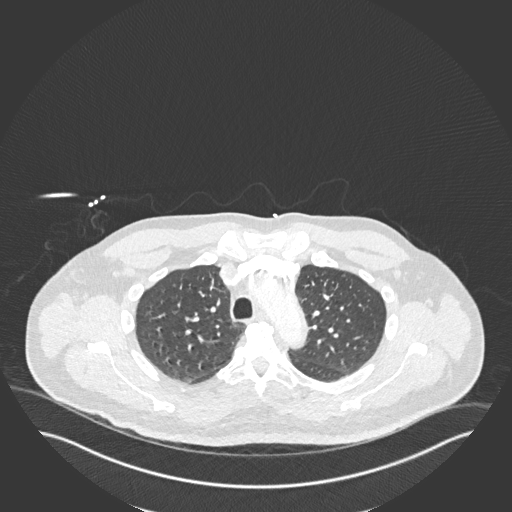
[im 174/224  mediastinal]
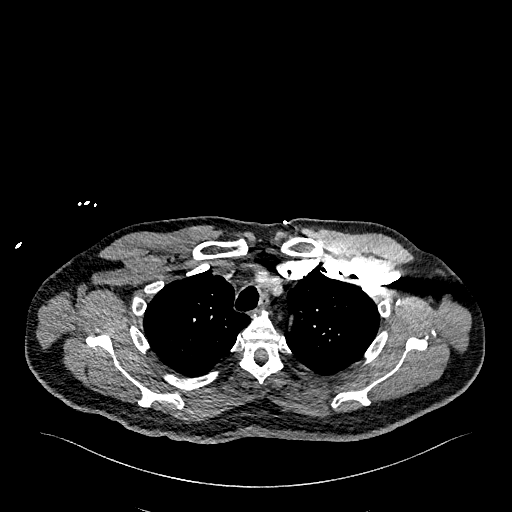
[im 186/224  lung]
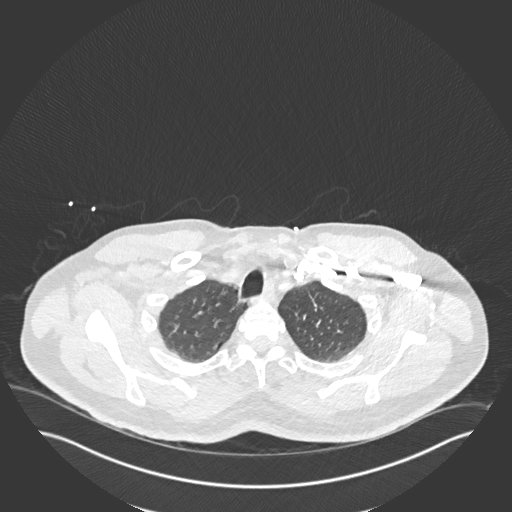
[im 199/224  mediastinal]
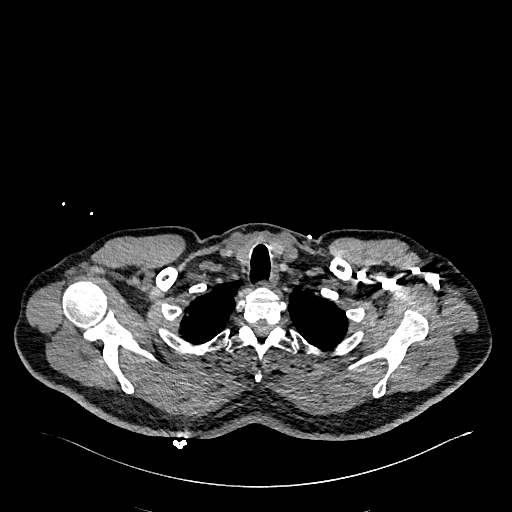
[im 211/224  lung]
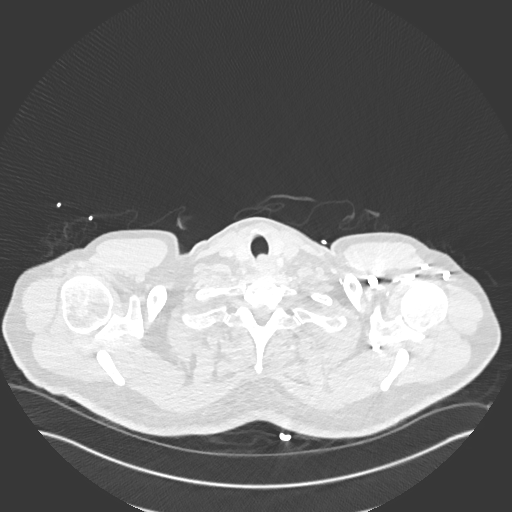

[18 of 36 positions shown; findings below may reference images not displayed]

FINDINGS: Cardiovascular: There is pulmonary embolus within the pulmonary
artery to the left lower lobe and left lingula. The RV/LV ratio is
1.3, corresponding to right heart strain and at least submassive
pulmonary embolus.

The heart remains normal in size. The thoracic aorta is unremarkable
in appearance. No calcific atherosclerotic disease is seen. The
great vessels are unremarkable in appearance.

Mediastinum/Nodes: The mediastinum is unremarkable in appearance. No
mediastinal lymphadenopathy is seen. No pericardial effusion is
identified. The visualized portions of the thyroid gland are
unremarkable. No axillary lymphadenopathy is seen.

Lungs/Pleura: Partial consolidation of the left lower lobe is
concerning for pulmonary infarct. Mild right basilar atelectasis is
noted. No significant pleural effusion or pneumothorax is seen. No
dominant mass is identified.

Upper Abdomen: The visualized portions of the liver and spleen are
unremarkable.

Musculoskeletal: No acute osseous abnormalities are identified.
Anterior bridging osteophytes are noted along the thoracic spine.
The visualized musculature is unremarkable in appearance.

Review of the MIP images confirms the above findings.
IMPRESSION: 1. Pulmonary embolus within the pulmonary artery to the left lower
lobe and left lingula. CT evidence of right heart strain (RV/LV
Ratio = 1.3) consistent with at least submassive (intermediate risk)
PE. The presence of right heart strain has been associated with an
increased risk of morbidity and mortality. Please activate Code PE
by paging [phone].
2. Pulmonary infarct noted at the left lower lung lobe. Mild right
basilar atelectasis noted.

Critical Value/emergent results were called by telephone at the time
of interpretation on 04/08/2017 at [DATE] to Dr. NAKHODA EDMOND,
who verbally acknowledged these results.

## 2019-06-03 DIAGNOSIS — Z23 Encounter for immunization: Secondary | ICD-10-CM | POA: Diagnosis not present

## 2019-07-11 DIAGNOSIS — Z8619 Personal history of other infectious and parasitic diseases: Secondary | ICD-10-CM | POA: Diagnosis not present

## 2019-07-11 DIAGNOSIS — D563 Thalassemia minor: Secondary | ICD-10-CM | POA: Diagnosis not present

## 2019-07-11 DIAGNOSIS — I10 Essential (primary) hypertension: Secondary | ICD-10-CM | POA: Diagnosis not present

## 2019-09-24 DIAGNOSIS — Z20822 Contact with and (suspected) exposure to covid-19: Secondary | ICD-10-CM | POA: Diagnosis not present

## 2019-09-24 DIAGNOSIS — Z03818 Encounter for observation for suspected exposure to other biological agents ruled out: Secondary | ICD-10-CM | POA: Diagnosis not present

## 2019-10-06 ENCOUNTER — Ambulatory Visit: Payer: PPO

## 2019-12-30 ENCOUNTER — Telehealth: Payer: Self-pay | Admitting: Internal Medicine

## 2019-12-30 NOTE — Telephone Encounter (Signed)
Pt is seeing his PCP for this care at this time

## 2019-12-30 NOTE — Telephone Encounter (Signed)
-----   Message from Cherylann Parr sent at 12/27/2019  1:38 PM EDT ----- Regarding: Appointment Called patient and he stated he was under the impression that his PCP would be managing his care from now on - he has an appointment with her the end of this month and said he would call us and let us know after he sees her.  ----- Message ----- From: Orland Penman, MD Sent: 12/27/2019  11:16 AM EDT To: [facility]  Please schedule pt for an OV

## 2019-12-30 NOTE — Telephone Encounter (Signed)
Pt did want [place] to know his A1C was 6.6 and he has an appt with his PCP May 21st.

## 2020-01-12 DIAGNOSIS — I517 Cardiomegaly: Secondary | ICD-10-CM | POA: Diagnosis not present

## 2020-01-12 DIAGNOSIS — Z125 Encounter for screening for malignant neoplasm of prostate: Secondary | ICD-10-CM | POA: Diagnosis not present

## 2020-01-12 DIAGNOSIS — Z8619 Personal history of other infectious and parasitic diseases: Secondary | ICD-10-CM | POA: Diagnosis not present

## 2020-01-12 DIAGNOSIS — I10 Essential (primary) hypertension: Secondary | ICD-10-CM | POA: Diagnosis not present

## 2020-01-12 DIAGNOSIS — Z7984 Long term (current) use of oral hypoglycemic drugs: Secondary | ICD-10-CM | POA: Diagnosis not present

## 2020-01-12 DIAGNOSIS — E1169 Type 2 diabetes mellitus with other specified complication: Secondary | ICD-10-CM | POA: Diagnosis not present

## 2020-01-12 DIAGNOSIS — Z0000 Encounter for general adult medical examination without abnormal findings: Secondary | ICD-10-CM | POA: Diagnosis not present

## 2020-01-12 DIAGNOSIS — Z86711 Personal history of pulmonary embolism: Secondary | ICD-10-CM | POA: Diagnosis not present

## 2020-01-12 DIAGNOSIS — Z79899 Other long term (current) drug therapy: Secondary | ICD-10-CM | POA: Diagnosis not present

## 2020-01-12 DIAGNOSIS — N400 Enlarged prostate without lower urinary tract symptoms: Secondary | ICD-10-CM | POA: Diagnosis not present

## 2020-01-12 DIAGNOSIS — D563 Thalassemia minor: Secondary | ICD-10-CM | POA: Diagnosis not present

## 2020-01-12 DIAGNOSIS — H00019 Hordeolum externum unspecified eye, unspecified eyelid: Secondary | ICD-10-CM | POA: Diagnosis not present

## 2020-03-01 ENCOUNTER — Other Ambulatory Visit: Payer: Self-pay | Admitting: Internal Medicine

## 2020-04-21 ENCOUNTER — Other Ambulatory Visit: Payer: Self-pay | Admitting: Internal Medicine

## 2020-05-01 DIAGNOSIS — E1165 Type 2 diabetes mellitus with hyperglycemia: Secondary | ICD-10-CM | POA: Diagnosis not present

## 2020-05-01 DIAGNOSIS — Z23 Encounter for immunization: Secondary | ICD-10-CM | POA: Diagnosis not present

## 2020-05-01 DIAGNOSIS — Z1211 Encounter for screening for malignant neoplasm of colon: Secondary | ICD-10-CM | POA: Diagnosis not present

## 2020-06-28 DIAGNOSIS — E1169 Type 2 diabetes mellitus with other specified complication: Secondary | ICD-10-CM | POA: Diagnosis not present

## 2020-06-28 DIAGNOSIS — Z79899 Other long term (current) drug therapy: Secondary | ICD-10-CM | POA: Diagnosis not present

## 2020-10-31 ENCOUNTER — Other Ambulatory Visit: Payer: Self-pay

## 2020-11-01 ENCOUNTER — Ambulatory Visit: Payer: PPO | Admitting: Internal Medicine

## 2020-11-01 ENCOUNTER — Encounter: Payer: Self-pay | Admitting: Internal Medicine

## 2020-11-01 VITALS — BP 124/78 | HR 87 | Ht 67.0 in | Wt 183.5 lb

## 2020-11-01 DIAGNOSIS — E1165 Type 2 diabetes mellitus with hyperglycemia: Secondary | ICD-10-CM

## 2020-11-01 DIAGNOSIS — E119 Type 2 diabetes mellitus without complications: Secondary | ICD-10-CM

## 2020-11-01 LAB — POCT GLUCOSE (DEVICE FOR HOME USE): POC Glucose: 345 mg/dl — AB (ref 70–99)

## 2020-11-01 LAB — POCT GLYCOSYLATED HEMOGLOBIN (HGB A1C): Hemoglobin A1C: 10.8 % — AB (ref 4.0–5.6)

## 2020-11-01 MED ORDER — INSULIN PEN NEEDLE 32G X 4 MM MISC: 1.0000 | Freq: Every day | 6 refills | Status: DC

## 2020-11-01 MED ORDER — TRESIBA FLEXTOUCH 100 UNIT/ML ~~LOC~~ SOPN: 16.0000 [IU] | PEN_INJECTOR | Freq: Every day | SUBCUTANEOUS | 6 refills | Status: DC

## 2020-11-01 MED ORDER — METFORMIN HCL ER 500 MG PO TB24: 1000.0000 mg | ORAL_TABLET | Freq: Two times a day (BID) | ORAL | 3 refills | Status: DC

## 2020-11-01 NOTE — Patient Instructions (Addendum)
Increase Metformin 500 mg 2 tablets with Breakfast and Supper  Start Tresiba 16 units daily       HOW TO TREAT LOW BLOOD SUGARS (Blood sugar LESS THAN 70 MG/DL)  Please follow the RULE OF 15 for the treatment of hypoglycemia treatment (when your (blood sugars are less than 70 mg/dL)    STEP 1: Take 15 grams of carbohydrates when your blood sugar is low, which includes:   3-4 GLUCOSE TABS  OR  3-4 OZ OF JUICE OR REGULAR SODA OR  ONE TUBE OF GLUCOSE GEL     STEP 2: RECHECK blood sugar in 15 MINUTES STEP 3: If your blood sugar is still low at the 15 minute recheck --> then, go back to STEP 1 and treat AGAIN with another 15 grams of carbohydrates.

## 2020-11-01 NOTE — Progress Notes (Signed)
Name: Todd Avila  Age/ Sex: 70 y.o., male   MRN/ DOB: [id number], 06-Feb-1951     PCP: [name], MD   Reason for Endocrinology Evaluation: Type 2 Diabetes Mellitus  Initial Endocrine Consultative Visit: 12/30/2018    PATIENT IDENTIFIER: Todd Avila is a 70 y.o. male with a past medical history of  HTN, PE and T2DM. The patient has followed with Endocrinology clinic since 12/30/2018 for consultative assistance with management of his diabetes.  DIABETIC HISTORY:   Todd Avila was diagnosed with T2DM in August, 2018 when he was admitted for hyperosmolar hyperglycemia with a BG > 1000 mg/dL. At the time of discharge he was on insulin and subsequently was able to stop insulin. His hemoglobin A1c has ranged from 5.1%in  10/2017, peaking at 14.5% in 03/2017.  Januvia stopped and Metformin started 12/2018 with an A1c of 5.7% and daily hypoglycemic episodes.   SUBJECTIVE:   During the last visit (12/30/2018): A1c 5.7% we started Metformin and stopped Janumet      Today (11/01/2020): Todd Avila is here for a follow up on diabetes. He has not been to our clinic in almost 2 years . He is accompanied by his spouse.  He checks his blood sugars multiple  times daily, through CGM. The patient has not had hypoglycemic episodes since the last clinic visit.   His A1c back in November was 6.7 % but has noted hyperglycemia in the recents month. Per wife , pt has been eating more sweets due to family events   No polyuria or polydipsia     HOME DIABETES REGIMEN:  Metformin 500 XR 2 tabs daily      Statin: yes ACE-I/ARB: Yes    CONTINUOUS GLUCOSE MONITORING RECORD INTERPRETATION    Dates of Recording: 2/11-3/05/2021  Sensor description:freestyle libre  Results statistics:   CGM use % of time 73  Average and SD 317/21.4  Time in range     1   %  % Time Above 180 16  % Time above 250 83  % Time Below target 0     Glycemic patterns summary: Hyperglycemia all day and  night   Hyperglycemic episodes all day and night   Hypoglycemic episodes occurred N/A  Overnight periods: high          DIABETIC COMPLICATIONS: Microvascular complications:    Denies: neuropathy, retinopathy, CKD  Last Eye Exam: Completed 2019  Macrovascular complications:    Denies: CAD, CVA, PVD   HISTORY:  Past Medical History:  Past Medical History:  Diagnosis Date  . Acute pulmonary embolism (HCC) 04/08/2017  . Altered mental status 03/28/2017   history of, for 3 days  . Atelectasis 04/08/2017   mild right basilar noted on CT chest  . Bladder calculi 03/29/2017   Large noted on US renal  . Bladder stones   . CHF (congestive heart failure) (HCC)    per ECHO  . DKA (diabetic ketoacidoses) 03/28/2017   HHS/DKA; hospitalized for 3 days/notes 04/08/2017  . DVT (deep venous thrombosis) (HCC)    Unsure if this is superficial thrombophlebitis, happened in ~2010 in [place]  . Dyspnea 03/2017   per ECHO  . History of kidney stones   . HTN (hypertension)   . Mass 2004   Left side of chest, massive infection per CT  . Nonischemic cardiomyopathy (HCC)    per ECHO  . Pneumonia 2011  . Pulmonary infarct (HCC) 04/08/2017   left lower lung lobe noted on CT of chest  .  Type II diabetes mellitus (HCC)    "dx'd 03/28/2017"   Past Surgical History:  Past Surgical History:  Procedure Laterality Date  . COLONOSCOPY    . CYSTOSCOPY WITH LITHOLAPAXY N/A 09/07/2017   Procedure: CYSTOSCOPY WITH LITHOLAPAXY;  Surgeon: Ihor Gully, MD;  Location: [facility];  Service: Urology;  Laterality: N/A;  . TONSILLECTOMY  1960    Social History:  reports that he quit smoking about 6 years ago. His smoking use included cigarettes. He has a 4.80 pack-year smoking history. He has never used smokeless tobacco. He reports current alcohol use. He reports that he does not use drugs. Family History:  Family History  Problem Relation Age of Onset  . Heart failure Father   . Diabetes Sister       HOME MEDICATIONS: Allergies as of 11/01/2020      Reactions   Amlodipine Other (See Comments)   "Feet Swelling"   Penicillins Other (See Comments)   Unknown Has patient had a PCN reaction causing immediate rash, facial/tongue/throat swelling, SOB or lightheadedness with hypotension: Unknown Has patient had a PCN reaction causing severe rash involving mucus membranes or skin necrosis: Unknown Has patient had a PCN reaction that required hospitalization: Unknown Has patient had a PCN reaction occurring within the last 10 years: No If all of the above answers are "NO", then may proceed with Cephalosporin use.      Medication List       Accurate as of November 01, 2020  9:55 AM. If you have any questions, ask your nurse or doctor.        FreeStyle Libre 14 Day Sensor Misc 1 application by Other route every 14 (fourteen) days.   losartan-hydrochlorothiazide 100-12.5 MG tablet Commonly known as: HYZAAR Take 0.5 tablets by mouth daily.   metFORMIN 500 MG 24 hr tablet Commonly known as: GLUCOPHAGE-XR TAKE 2 TABLETS (1,000 MG TOTAL) BY MOUTH DAILY WITH SUPPER.   Rosuvastatin Calcium 5 MG Cpsp        OBJECTIVE:   Vital Signs: BP 124/78   Pulse 87   Ht 5\' 7"  (1.702 m)   Wt 183 lb 8 oz (83.2 kg)   SpO2 98%   BMI 28.74 kg/m   Wt Readings from Last 3 Encounters:  11/01/20 183 lb 8 oz (83.2 kg)  10/01/18 197 lb (89.4 kg)  07/08/18 189 lb (85.7 kg)     Exam: General: Pt appears well and is in NAD  Lungs: Clear with good BS bilat with no rales, rhonchi, or wheezes  Heart: RRR  Abdomen: Normoactive bowel sounds, soft, nontender, without masses or organomegaly palpable  Extremities: No pretibial edema  Neuro: MS is good with appropriate affect, pt is alert and Ox3      DATA REVIEWED:  Lab Results  Component Value Date   HGBA1C 10.8 (A) 11/01/2020   HGBA1C 5.7 (A) 10/01/2018   HGBA1C 7.0 (H) 05/24/2018   Lab Results  Component Value Date   MICROALBUR 16.0  (H) 10/01/2018   LDLCALC 83 10/01/2018   CREATININE 1.04 10/01/2018   Lab Results  Component Value Date   MICRALBCREAT 6.3 10/01/2018     Lab Results  Component Value Date   CHOL 134 10/01/2018   HDL 34.70 (L) 10/01/2018   LDLCALC 83 10/01/2018   TRIG 81.0 10/01/2018   CHOLHDL 4 10/01/2018         ASSESSMENT / PLAN / RECOMMENDATIONS:   1) Type 2 Diabetes Mellitus, Poorly controlled, Without complications - Most recent A1c of  10.8 %. Goal A1c < 7.0 %.    - Severe Hyperglycemia for the past few months  - We discussed starting insulin with an A1c of > 10.0 % . He is in agreement of this, he was shown how to use insulin pen today  - Will titrate metformin as below  - He did well with Janumet in the past, will consider a GLP-1 agonist in the future     MEDICATIONS: Increase Metformin 500 mg 2 tablets with Breakfast and Supper  Start Tresiba 16 units daily    EDUCATION / INSTRUCTIONS:  BG monitoring instructions: Patient is instructed to check his blood sugars 3 times a day, before meals .  Call [facility] if: BG persistently < 70  . I reviewed the Rule of 15 for the treatment of hypoglycemia in detail with the patient. Literature supplied.   2) Diabetic complications:   Eye: Does not have known diabetic retinopathy.   Neuro/ Feet: Does not have known diabetic peripheral neuropathy .   Renal: Patient does not have known baseline CKD. He   is not on an ACEI/ARB at present.   F/U in 3 months    Signed electronically by: Lyndle Herrlich, MD  [facility] Endocrinology  [facility] [address] [place], [place] [address] Phone: [phone] FAX: [phone]   CC: [name], MD [address] [place] [address] Phone: [phone]  Fax: [phone]  Return to Endocrinology clinic as below: No future appointments.

## 2020-11-02 DIAGNOSIS — E1165 Type 2 diabetes mellitus with hyperglycemia: Secondary | ICD-10-CM | POA: Insufficient documentation

## 2020-11-14 ENCOUNTER — Encounter (INDEPENDENT_AMBULATORY_CARE_PROVIDER_SITE_OTHER): Payer: Self-pay | Admitting: Ophthalmology

## 2020-11-14 ENCOUNTER — Ambulatory Visit (INDEPENDENT_AMBULATORY_CARE_PROVIDER_SITE_OTHER): Payer: PPO | Admitting: Ophthalmology

## 2020-11-14 ENCOUNTER — Other Ambulatory Visit: Payer: Self-pay

## 2020-11-14 DIAGNOSIS — H25813 Combined forms of age-related cataract, bilateral: Secondary | ICD-10-CM | POA: Diagnosis not present

## 2020-11-14 DIAGNOSIS — H35033 Hypertensive retinopathy, bilateral: Secondary | ICD-10-CM | POA: Diagnosis not present

## 2020-11-14 DIAGNOSIS — E119 Type 2 diabetes mellitus without complications: Secondary | ICD-10-CM | POA: Diagnosis not present

## 2020-11-14 DIAGNOSIS — H3581 Retinal edema: Secondary | ICD-10-CM

## 2020-11-14 DIAGNOSIS — H353132 Nonexudative age-related macular degeneration, bilateral, intermediate dry stage: Secondary | ICD-10-CM | POA: Diagnosis not present

## 2020-11-14 DIAGNOSIS — I10 Essential (primary) hypertension: Secondary | ICD-10-CM

## 2020-11-14 NOTE — Progress Notes (Signed)
[facility] Note  11/14/2020     CHIEF COMPLAINT Patient presents for Retina Evaluation   HISTORY OF PRESENT ILLNESS: Todd Avila is a 70 y.o. male who presents to the clinic today for:   HPI    Retina Evaluation    In both eyes.  This started weeks ago.  Duration of weeks.  Context:  distance vision.  I, the attending physician,  performed the HPI with the patient and updated documentation appropriately.          Comments    NP ret eval referred by Dr. Stephanie Acre Pt is originally from [place].  States he is adopted and knows little about his biological family--patient knew his biological Father but did not know Mother.  Patient was told at very young age that he had a "lazy eye" OD.  Pts adoptive Mother declined option for surgical intervention.    Patient denies decrease in his vision OU.  He denies eye pain.  Pt denies any new or worsening floaters or fol OU.  Pt states he is aware of presence of drusen OU and is aware of macular degeneration OU.  Pt has never had any ocular surgeries, lasers, or injections.  Pt states his A1c went from 6.7 to 10.8 in a relatively short period of time and PCP is concerned about pancreas.        Last edited by Bernarda Caffey, MD on 11/14/2020  9:31 AM. (History)    pt was dx as diabetic in 2018 with A1c of 14.5 and BG over 1000, most recent A1c was 10.8 on 03.10.22, pts A1c in November was 6.7, pt recently got re-established with endocrinology, pt states he has been wearing glasses most of his life, but doesn't feel like he has had any visual changes since November, he denies any eye sx, pt is aware he has dry macular degeneration, but states he is not checking his vision on an amsler grid or taking any eye vitamins, pts general ophthalmologist is Dr. Herbert Deaner, but he has not seen her since December 2019, pt is currently taking 16 units of Tresiba  Referring physician: Monna Fam, [place],   [id number]  HISTORICAL INFORMATION:   Selected notes from the MEDICAL RECORD NUMBER Referred by Dr. Stephanie Acre for diabetic eye exam LEE:  Ocular Hx- PMH-    CURRENT MEDICATIONS: No current outpatient medications on file. (Ophthalmic Drugs)   No current facility-administered medications for this visit. (Ophthalmic Drugs)   Current Outpatient Medications (Other)  Medication Sig  . Continuous Blood Gluc Sensor (FREESTYLE LIBRE 14 DAY SENSOR) MISC 1 application by Other route every 14 (fourteen) days.  . insulin degludec (TRESIBA FLEXTOUCH) 100 UNIT/ML FlexTouch Pen Inject 16 Units into the skin daily.  . Insulin Pen Needle 32G X 4 MM MISC 1 Device by Does not apply route daily.  Marland Kitchen losartan-hydrochlorothiazide (HYZAAR) 100-12.5 MG tablet Take 0.5 tablets by mouth daily.  . metFORMIN (GLUCOPHAGE-XR) 500 MG 24 hr tablet Take 2 tablets (1,000 mg total) by mouth 2 (two) times daily.  . Rosuvastatin Calcium 5 MG CPSP    No current facility-administered medications for this visit. (Other)      REVIEW OF SYSTEMS: ROS    Positive for: Endocrine, Eyes, Respiratory   Negative for: Constitutional, Gastrointestinal, Neurological (F), Skin, Genitourinary, Musculoskeletal, HENT, Cardiovascular, Psychiatric, Allergic/Imm, Heme/Lymph   Last edited by Doneen Poisson on 11/14/2020  8:51 AM. (History)  ALLERGIES Allergies  Allergen Reactions  . Amlodipine Other (See Comments)    "Feet Swelling"  . Penicillins Other (See Comments)    Unknown Has patient had a PCN reaction causing immediate rash, facial/tongue/throat swelling, SOB or lightheadedness with hypotension: Unknown Has patient had a PCN reaction causing severe rash involving mucus membranes or skin necrosis: Unknown Has patient had a PCN reaction that required hospitalization: Unknown Has patient had a PCN reaction occurring within the last 10 years: No If all of the above answers are "NO", then may proceed with  Cephalosporin use.     PAST MEDICAL HISTORY Past Medical History:  Diagnosis Date  . Acute pulmonary embolism ([facility]) 04/08/2017  . Altered mental status 03/28/2017   history of, for 3 days  . Atelectasis 04/08/2017   mild right basilar noted on CT chest  . Bladder calculi 03/29/2017   Large noted on US renal  . Bladder stones   . CHF (congestive heart failure) ([facility])    per ECHO  . DKA (diabetic ketoacidoses) 03/28/2017   HHS/DKA; hospitalized for 3 days/notes 04/08/2017  . DVT (deep venous thrombosis) ([facility])    Unsure if this is superficial thrombophlebitis, happened in ~2010 in [place]  . Dyspnea 03/2017   per ECHO  . History of kidney stones   . HTN (hypertension)   . Mass 2004   Left side of chest, massive infection per CT  . Nonischemic cardiomyopathy ([facility])    per ECHO  . Pneumonia 2011  . Pulmonary infarct ([facility]) 04/08/2017   left lower lung lobe noted on CT of chest  . Type II diabetes mellitus ([facility])    "dx'd 03/28/2017"   Past Surgical History:  Procedure Laterality Date  . COLONOSCOPY    . CYSTOSCOPY WITH LITHOLAPAXY N/A 09/07/2017   Procedure: CYSTOSCOPY WITH LITHOLAPAXY;  Surgeon: Kathie Rhodes, MD;  Location: [facility];  Service: Urology;  Laterality: N/A;  . TONSILLECTOMY  1960    FAMILY HISTORY Family History  Problem Relation Age of Onset  . Heart failure Father   . Diabetes Sister     SOCIAL HISTORY Social History   Tobacco Use  . Smoking status: Former Smoker    Packs/day: 0.12    Years: 40.00    Pack years: 4.80    Types: Cigarettes    Quit date: 10/24/2014    Years since quitting: 6.0  . Smokeless tobacco: Never Used  Vaping Use  . Vaping Use: Never used  Substance Use Topics  . Alcohol use: Yes    Comment: 04/08/2017 "might have a beer or shot of liquor maybe once/month"  . Drug use: No         OPHTHALMIC EXAM:  Base Eye Exam    Visual Acuity (Snellen - Linear)      Right Left   Dist cc 20/70 - 20/30 +2   Dist ph cc 20/40 -2 NI    Correction: Glasses       Tonometry (Tonopen, 8:46 AM)      Right Left   Pressure 12 12       Pupils      Dark Light Shape React APD   Right 4 3 Round Brisk 0   Left 4 3 Round Brisk 0       Visual Fields      Left Right    Full Full       Extraocular Movement      Right Left    Full Full  Neuro/Psych    Oriented x3: Yes   Mood/Affect: Normal       Dilation    Both eyes: 1.0% Mydriacyl, 2.5% Phenylephrine @ 8:46 AM        Slit Lamp and Fundus Exam    Slit Lamp Exam      Right Left   Lids/Lashes Dermatochalasis - upper lid Dermatochalasis - upper lid   Conjunctiva/Sclera White and quiet White and quiet   Cornea arcus arcus, trace PEE   Anterior Chamber Deep and quiet Deep and quiet   Iris Round and dilated, No NVI Round and dilated, No NVI   Lens 2+ Nuclear sclerosis, 2-3+ Cortical cataract 2+ Nuclear sclerosis, 2-3+ Cortical cataract   Vitreous Vitreous syneresis Vitreous syneresis       Fundus Exam      Right Left   Disc Pink and Sharp Pink and Sharp   C/D Ratio 0.4 0.6   Macula Flat, Blunted foveal reflex, Drusen, RPE mottling, No heme or edema Flat, good foveal reflex, Drusen, RPE mottling, No heme or edema   Vessels attenuated, Tortuous attenuated, Tortuous   Periphery Attached, no heme    Attached; no heme        Refraction    Wearing Rx      Sphere Cylinder Axis Add   Right -2.75 +0.75 080 +2.50   Left -2.25 +0.50 085 +2.50       Manifest Refraction      Sphere Cylinder Axis Dist VA   Right NI +/-3.00      Left -1.50 +0.50 075 20/30+2          IMAGING AND PROCEDURES  Imaging and Procedures for 11/14/2020  OCT, Retina - OU - Both Eyes       Right Eye Quality was good. Central Foveal Thickness: 271. Progression has no prior data. Findings include normal foveal contour, no IRF, no SRF, retinal drusen .   Left Eye Quality was good. Central Foveal Thickness: 292. Progression has no prior data. Findings include normal foveal  contour, no IRF, no SRF, retinal drusen .   Notes *Images captured and stored on drive  Diagnosis / Impression:  NFP, no IRF/SRF OU Non-exu ARMD OU No DME OU  Clinical management:  See below  Abbreviations: NFP - Normal foveal profile. CME - cystoid macular edema. PED - pigment epithelial detachment. IRF - intraretinal fluid. SRF - subretinal fluid. EZ - ellipsoid zone. ERM - epiretinal membrane. ORA - outer retinal atrophy. ORT - outer retinal tubulation. SRHM - subretinal hyper-reflective material. IRHM - intraretinal hyper-reflective material                 ASSESSMENT/PLAN:    ICD-10-CM   1. Diabetes mellitus type 2 without retinopathy ([facility])  E11.9   2. Retinal edema  H35.81 OCT, Retina - OU - Both Eyes  3. Intermediate stage nonexudative age-related macular degeneration of both eyes  H35.3132   4. Essential hypertension  I10   5. Hypertensive retinopathy of both eyes  H35.033   6. Combined forms of age-related cataract of both eyes  H25.813     1,2. Diabetes mellitus, type 2 without retinopathy  - A1c10.8 on 3.10.22 from 6.7 in Nov 2021, diagnosed in 2018 w/ A1c of 14.5 - The incidence, risk factors for progression, natural history and treatment options for diabetic retinopathy  were discussed with patient.   - The need for close monitoring of blood glucose, blood pressure, and serum lipids, avoiding cigarette or any type of  tobacco, and the need for long term follow up was also discussed with patient. - f/u in 1 year, sooner prn  3. Age related macular degeneration, non-exudative, OU  - The incidence, anatomy, and pathology of dry AMD, risk of progression, and the AREDS and AREDS 2 studies including smoking risks discussed with patient.  - Recommend continuing amsler grid monitoring  4,5. Hypertensive retinopathy OU - discussed importance of tight BP control - monitor  6.Mixed Cataract OU - The symptoms of cataract, surgical options, and treatments and risks  were discussed with patient. - discussed diagnosis and progression - pt was last seen at [facility] in 2019 - advised re-establishment of primary eye care  Ophthalmic Meds Ordered this visit:  No orders of the defined types were placed in this encounter.      Return in about 1 year (around 11/14/2021) for f/u DM exam, DFE, OCT.  There are no Patient Instructions on file for this visit.   Explained the diagnoses, plan, and follow up with the patient and they expressed understanding.  Patient expressed understanding of the importance of proper follow up care.   This document serves as a record of services personally performed by Gardiner Sleeper, MD, PhD. It was created on their behalf by San Jetty. Owens Shark, OA an ophthalmic technician. The creation of this record is the provider's dictation and/or activities during the visit.    Electronically signed by: San Jetty. Owens Shark, [place] 03.23.2022 9:35 AM   Gardiner Sleeper, M.D., Ph.D. Diseases & Surgery of the Retina and Vitreous [facility]  I have reviewed the above documentation for accuracy and completeness, and I agree with the above. Gardiner Sleeper, M.D., Ph.D. 11/14/20 9:35 AM   Abbreviations: M myopia (nearsighted); A astigmatism; H hyperopia (farsighted); P presbyopia; Mrx spectacle prescription;  CTL contact lenses; OD right eye; OS left eye; OU both eyes  XT exotropia; ET esotropia; PEK punctate epithelial keratitis; PEE punctate epithelial erosions; DES dry eye syndrome; MGD meibomian gland dysfunction; ATs artificial tears; PFAT's preservative free artificial tears; [place] nuclear sclerotic cataract; PSC posterior subcapsular cataract; ERM epi-retinal membrane; PVD posterior vitreous detachment; RD retinal detachment; DM diabetes mellitus; DR diabetic retinopathy; NPDR non-proliferative diabetic retinopathy; PDR proliferative diabetic retinopathy; CSME clinically significant macular edema; DME diabetic macular  edema; dbh dot blot hemorrhages; CWS cotton wool spot; POAG primary open angle glaucoma; C/D cup-to-disc ratio; HVF humphrey visual field; GVF goldmann visual field; OCT optical coherence tomography; IOP intraocular pressure; BRVO Branch retinal vein occlusion; CRVO central retinal vein occlusion; CRAO central retinal artery occlusion; BRAO branch retinal artery occlusion; RT retinal tear; SB scleral buckle; PPV pars plana vitrectomy; VH Vitreous hemorrhage; PRP panretinal laser photocoagulation; IVK intravitreal kenalog; VMT vitreomacular traction; MH Macular hole;  NVD neovascularization of the disc; NVE neovascularization elsewhere; AREDS age related eye disease study; ARMD age related macular degeneration; POAG primary open angle glaucoma; EBMD epithelial/anterior basement membrane dystrophy; ACIOL anterior chamber intraocular lens; IOL intraocular lens; PCIOL posterior chamber intraocular lens; Phaco/IOL phacoemulsification with intraocular lens placement; [place] photorefractive keratectomy; LASIK laser assisted in situ keratomileusis; HTN hypertension; DM diabetes mellitus; COPD chronic obstructive pulmonary disease

## 2020-11-15 ENCOUNTER — Ambulatory Visit: Payer: PPO | Admitting: Internal Medicine

## 2020-12-18 ENCOUNTER — Telehealth: Payer: Self-pay | Admitting: Internal Medicine

## 2020-12-18 NOTE — Telephone Encounter (Signed)
It shows Dr Lonzo Cloud sent 90 days supply on 11/01/2020 with 3 refills that is 1 year supply.  [facility] will need to [facility]

## 2020-12-18 NOTE — Telephone Encounter (Signed)
REFILL REQUEST:  Metformin (90 day supply - patient said since his dose increased, he has run out faster than usual)  PHARMACY:  [id number] [place], [place] - [address]  [address] [place], [place] [address]  Phone:  [phone] Fax:  [phone]

## 2021-01-01 ENCOUNTER — Telehealth: Payer: Self-pay | Admitting: Internal Medicine

## 2021-01-01 NOTE — Telephone Encounter (Signed)
PLease ask the pt to reduce tresiba to 12 units daily. He may continue to drop for the next 4 days, because it will take that long for the 12 units to fully kick in

## 2021-01-01 NOTE — Telephone Encounter (Signed)
Patient called to advise that he is having low fasting blood sugar levels (see below) and is also having non-fasting mid day blood sugars no higher than 120.  Patient is travelling to [place] for very sick family member and needs to know if his medication needs to be changed a bit as he does not want to having any problems while travelling and away from his doctor  Date Time Reading Notes       01/01/21 Fasting AM 57   12/31/20 Fasting AM 76   12/30/20 Fasting AM 69   12/29/20 Fasting AM 72   12/28/20 Fasting AM 78     Please advise @ [phone]

## 2021-01-01 NOTE — Telephone Encounter (Signed)
Spoken to patient and notified Dr Shamleffer's comments. Verbalized understanding.   

## 2021-01-07 ENCOUNTER — Encounter: Payer: Self-pay | Admitting: Internal Medicine

## 2021-01-07 ENCOUNTER — Ambulatory Visit: Payer: PPO | Admitting: Internal Medicine

## 2021-01-07 ENCOUNTER — Other Ambulatory Visit: Payer: Self-pay

## 2021-01-07 ENCOUNTER — Telehealth: Payer: Self-pay | Admitting: Internal Medicine

## 2021-01-07 VITALS — BP 148/92 | HR 89 | Ht 67.0 in | Wt 186.0 lb

## 2021-01-07 DIAGNOSIS — E1165 Type 2 diabetes mellitus with hyperglycemia: Secondary | ICD-10-CM | POA: Diagnosis not present

## 2021-01-07 LAB — POCT GLYCOSYLATED HEMOGLOBIN (HGB A1C): Hemoglobin A1C: 6.6 % — AB (ref 4.0–5.6)

## 2021-01-07 MED ORDER — ONETOUCH ULTRA VI STRP: 1.0000 | ORAL_STRIP | Freq: Every day | 11 refills | Status: DC

## 2021-01-07 NOTE — Telephone Encounter (Signed)
Pt calling in stating that blood sugar is still low. States that he is going out of town Wednesday for [employer] and needs to see the doctor before he goes.

## 2021-01-07 NOTE — Patient Instructions (Addendum)
-   A1c 6.6 %  - Continue Metformin 500 mg 2 tablets with Breakfast and 2 tablet with Supper  - STOP Evaristo Bury       HOW TO TREAT LOW BLOOD SUGARS (Blood sugar LESS THAN 70 MG/DL)  Please follow the RULE OF 15 for the treatment of hypoglycemia treatment (when your (blood sugars are less than 70 mg/dL)    STEP 1: Take 15 grams of carbohydrates when your blood sugar is low, which includes:   3-4 GLUCOSE TABS  OR  3-4 OZ OF JUICE OR REGULAR SODA OR  ONE TUBE OF GLUCOSE GEL     STEP 2: RECHECK blood sugar in 15 MINUTES STEP 3: If your blood sugar is still low at the 15 minute recheck --> then, go back to STEP 1 and treat AGAIN with another 15 grams of carbohydrates.

## 2021-01-07 NOTE — Progress Notes (Addendum)
Name: Todd Avila  Age/ Sex: 70 y.o., male   MRN/ DOB: [id number], Jan 22, 1951     PCP: [name], MD   Reason for Endocrinology Evaluation: Type 2 Diabetes Mellitus  Initial Endocrine Consultative Visit: 12/30/2018    PATIENT IDENTIFIER: Todd Avila is a 70 y.o. male with a past medical history of  HTN, PE and T2DM. The patient has followed with Endocrinology clinic since 12/30/2018 for consultative assistance with management of his diabetes.  DIABETIC HISTORY:   Todd Avila was diagnosed with T2DM in August, 2018 when he was admitted for hyperosmolar hyperglycemia with a BG > 1000 mg/dL. At the time of discharge he was on insulin and subsequently was able to stop insulin. His hemoglobin A1c has ranged from 5.1%in  10/2017, peaking at 14.5% in 03/2017.  Januvia stopped and Metformin started 12/2018 with an A1c of 5.7%  He was lost to follow up until 10/2020 when he returned with A1c 10.8 % insulin was started     SUBJECTIVE:   During the last visit (11/01/2020): A1c 10.8% we started Metformin and stopped Janumet      Today (01/07/2021): Todd Avila is here for a follow up on diabetes. He is accompanied by his spouse.  He checks his blood sugars multiple  times daily, through CGM. The patient has not had hypoglycemic episodes since the last clinic visit.  Denies nausea or diarrhea      HOME DIABETES REGIMEN:  Metformin 500 XR 2 tabs BID Tresiba 12 units daily      Statin: yes ACE-I/ARB: Yes    CONTINUOUS GLUCOSE MONITORING RECORD INTERPRETATION    Dates of Recording: 4/19-5/16/2022 Sensor description:freestyle libre  Results statistics:   CGM use % of time 43  Average and SD 86/38.7  Time in range     66  %  % Time Above 180 0  % Time above 250 0  % Time Below target 15     Glycemic patterns summary: Hyperglycemia all day and night   Hyperglycemic episodes all day and night   Hypoglycemic episodes occurred N/A  Overnight periods: high           DIABETIC COMPLICATIONS: Microvascular complications:    Denies: neuropathy, retinopathy, CKD  Last Eye Exam: Completed 11/14/2020  Macrovascular complications:    Denies: CAD, CVA, PVD   HISTORY:  Past Medical History:  Past Medical History:  Diagnosis Date  . Acute pulmonary embolism ([facility]) 04/08/2017  . Altered mental status 03/28/2017   history of, for 3 days  . Atelectasis 04/08/2017   mild right basilar noted on CT chest  . Bladder calculi 03/29/2017   Large noted on US renal  . Bladder stones   . CHF (congestive heart failure) ([facility])    per ECHO  . DKA (diabetic ketoacidoses) 03/28/2017   HHS/DKA; hospitalized for 3 days/notes 04/08/2017  . DVT (deep venous thrombosis) ([facility])    Unsure if this is superficial thrombophlebitis, happened in ~2010 in [place]  . Dyspnea 03/2017   per ECHO  . History of kidney stones   . HTN (hypertension)   . Mass 2004   Left side of chest, massive infection per CT  . Nonischemic cardiomyopathy ([facility])    per ECHO  . Pneumonia 2011  . Pulmonary infarct ([facility]) 04/08/2017   left lower lung lobe noted on CT of chest  . Type II diabetes mellitus ([facility])    "dx'd 03/28/2017"   Past Surgical History:  Past Surgical History:  Procedure Laterality Date  .  COLONOSCOPY    . CYSTOSCOPY WITH LITHOLAPAXY N/A 09/07/2017   Procedure: CYSTOSCOPY WITH LITHOLAPAXY;  Surgeon: Ihor Gully, MD;  Location: [facility];  Service: Urology;  Laterality: N/A;  . TONSILLECTOMY  1960    Social History:  reports that he quit smoking about 6 years ago. His smoking use included cigarettes. He has a 4.80 pack-year smoking history. He has never used smokeless tobacco. He reports current alcohol use. He reports that he does not use drugs. Family History:  Family History  Problem Relation Age of Onset  . Heart failure Father   . Diabetes Sister      HOME MEDICATIONS: Allergies as of 01/07/2021      Reactions   Amlodipine Other (See Comments)   "Feet  Swelling"   Penicillins Other (See Comments)   Unknown Has patient had a PCN reaction causing immediate rash, facial/tongue/throat swelling, SOB or lightheadedness with hypotension: Unknown Has patient had a PCN reaction causing severe rash involving mucus membranes or skin necrosis: Unknown Has patient had a PCN reaction that required hospitalization: Unknown Has patient had a PCN reaction occurring within the last 10 years: No If all of the above answers are "NO", then may proceed with Cephalosporin use.      Medication List       Accurate as of Jan 07, 2021  4:30 PM. If you have any questions, ask your nurse or doctor.        FreeStyle Libre 14 Day Sensor Misc 1 application by Other route every 14 (fourteen) days.   Insulin Pen Needle 32G X 4 MM Misc 1 Device by Does not apply route daily.   losartan-hydrochlorothiazide 100-12.5 MG tablet Commonly known as: HYZAAR Take 0.5 tablets by mouth daily.   metFORMIN 500 MG 24 hr tablet Commonly known as: GLUCOPHAGE-XR Take 2 tablets (1,000 mg total) by mouth 2 (two) times daily.   OneTouch Ultra test strip Generic drug: glucose blood 1 each by Other route daily. Use as instructed Started by: Scarlette Shorts, MD   Rosuvastatin Calcium 5 MG Cpsp   Tresiba FlexTouch 100 UNIT/ML FlexTouch Pen Generic drug: insulin degludec Inject 16 Units into the skin daily. What changed: how much to take        OBJECTIVE:   Vital Signs: BP (!) 148/92   Pulse 89   Ht 5\' 7"  (1.702 m)   Wt 186 lb (84.4 kg)   SpO2 98%   BMI 29.13 kg/m   Wt Readings from Last 3 Encounters:  01/07/21 186 lb (84.4 kg)  11/01/20 183 lb 8 oz (83.2 kg)  10/01/18 197 lb (89.4 kg)     Exam: General: Pt appears well and is in NAD  Lungs: Clear with good BS bilat with no rales, rhonchi, or wheezes  Heart: RRR  Abdomen: Normoactive bowel sounds, soft, nontender, without masses or organomegaly palpable  Extremities: No pretibial edema  Neuro: MS is  good with appropriate affect, pt is alert and Ox3   DM Foot Exam 01/07/2021  The skin of the feet is intact without sores or ulcerations. The pedal pulses are 2+ on right and 2+ on left. The sensation is intact to a screening 5.07, 10 gram monofilament bilaterally    DATA REVIEWED:  Lab Results  Component Value Date   HGBA1C 10.8 (A) 11/01/2020   HGBA1C 5.7 (A) 10/01/2018   HGBA1C 7.0 (H) 05/24/2018   Results for Todd Avila, Todd Avila (MRN Sadie Haber) as of 01/08/2021 13:07  Ref. Range 01/07/2021 16:32  Sodium Latest  Ref Range: 135 - 145 mEq/L 140  Potassium Latest Ref Range: 3.5 - 5.1 mEq/L 4.1  Chloride Latest Ref Range: 96 - 112 mEq/L 103  CO2 Latest Ref Range: 19 - 32 mEq/L 31  Glucose Latest Ref Range: 70 - 99 mg/dL 89  BUN Latest Ref Range: 6 - 23 mg/dL 18  Creatinine Latest Ref Range: 0.40 - 1.50 mg/dL 2.54  Calcium Latest Ref Range: 8.4 - 10.5 mg/dL 9.8  GFR Latest Ref Range: >60.00 mL/min 79.51  MICROALB/CREAT RATIO Latest Ref Range: 0.0 - 30.0 mg/g 9.4  Creatinine,U Latest Units: mg/dL 270.6  Microalb, Ur Latest Ref Range: 0.0 - 1.9 mg/dL 23.7 (H)     ASSESSMENT / PLAN / RECOMMENDATIONS:   1) Type 2 Diabetes Mellitus, with recurrent Hypoglycemia, Without complications - Most recent A1c of 6.6 %. Goal A1c < 7.0 %.     - Pt with recurrent hypoglycemia, despite decreasing insulin. He is going on a month long trip to [place] and is concerned about hypoglycemia  - Will stop Insulin  - If his BG's are consistently > 180 mg/dL will consider a GLP-1 agonist or SGLT-2 inhibitor. Of note, he has been on Januvia in the past without side effects      MEDICATIONS: Continue  Metformin 500 mg 2 tablets with Breakfast and Supper  Stop [place]    EDUCATION / INSTRUCTIONS:  BG monitoring instructions: Patient is instructed to check his blood sugars 3 times a day, before meals .  Call [facility] if: BG persistently < 70  . I reviewed the Rule of 15 for the  treatment of hypoglycemia in detail with the patient. Literature supplied.   2) Diabetic complications:   Eye: Does not have known diabetic retinopathy.   Neuro/ Feet: Does not have known diabetic peripheral neuropathy .   Renal: Patient does not have known baseline CKD. He   is not on an ACEI/ARB at present.   F/U in June, 2022   Signed electronically by: Lyndle Herrlich, MD  [facility]  [facility] [address] [place], [place] [address] Phone: [phone] FAX: [phone]   CC: [name], MD [address] [place] [address] Phone: [phone]  Fax: [phone]  Return to Endocrinology clinic as below: Future Appointments  Date Time Provider Department Center  02/06/2021  8:50 AM Anetha Slagel, Konrad Dolores, MD [facility] None  11/14/2021  8:00 AM Rennis Chris, MD [facility] None

## 2021-01-07 NOTE — Telephone Encounter (Signed)
Please advise 

## 2021-01-07 NOTE — Telephone Encounter (Signed)
error 

## 2021-01-08 LAB — BASIC METABOLIC PANEL
BUN: 18 mg/dL (ref 6–23)
CO2: 31 mEq/L (ref 19–32)
Calcium: 9.8 mg/dL (ref 8.4–10.5)
Chloride: 103 mEq/L (ref 96–112)
Creatinine, Ser: 0.97 mg/dL (ref 0.40–1.50)
GFR: 79.51 mL/min (ref >60.00)
Glucose, Bld: 89 mg/dL (ref 70–99)
Potassium: 4.1 mEq/L (ref 3.5–5.1)
Sodium: 140 mEq/L (ref 135–145)

## 2021-01-08 LAB — MICROALBUMIN / CREATININE URINE RATIO
Creatinine,U: 157.8 mg/dL
Microalb Creat Ratio: 9.4 mg/g (ref 0.0–30.0)
Microalb, Ur: 14.9 mg/dL — ABNORMAL HIGH (ref 0.0–1.9)

## 2021-01-10 ENCOUNTER — Other Ambulatory Visit: Payer: Self-pay | Admitting: *Deleted

## 2021-01-10 MED ORDER — ONETOUCH ULTRA VI STRP: 1.0000 | ORAL_STRIP | Freq: Every day | 11 refills | Status: AC

## 2021-02-06 ENCOUNTER — Ambulatory Visit (INDEPENDENT_AMBULATORY_CARE_PROVIDER_SITE_OTHER): Payer: PPO | Admitting: Internal Medicine

## 2021-02-06 ENCOUNTER — Encounter: Payer: Self-pay | Admitting: Internal Medicine

## 2021-02-06 ENCOUNTER — Other Ambulatory Visit: Payer: Self-pay

## 2021-02-06 VITALS — BP 142/88 | HR 88 | Ht 67.0 in | Wt 185.0 lb

## 2021-02-06 DIAGNOSIS — E119 Type 2 diabetes mellitus without complications: Secondary | ICD-10-CM

## 2021-02-06 NOTE — Patient Instructions (Signed)
-   Continue Metformin 500 mg 2 tablets with Breakfast and 2 tablet with Supper      HOW TO TREAT LOW BLOOD SUGARS (Blood sugar LESS THAN 70 MG/DL) Please follow the RULE OF 15 for the treatment of hypoglycemia treatment (when your (blood sugars are less than 70 mg/dL)   STEP 1: Take 15 grams of carbohydrates when your blood sugar is low, which includes:  3-4 GLUCOSE TABS  OR 3-4 OZ OF JUICE OR REGULAR SODA OR ONE TUBE OF GLUCOSE GEL    STEP 2: RECHECK blood sugar in 15 MINUTES STEP 3: If your blood sugar is still low at the 15 minute recheck --> then, go back to STEP 1 and treat AGAIN with another 15 grams of carbohydrates.

## 2021-02-06 NOTE — Progress Notes (Signed)
Name: Todd Avila  Age/ Sex: 70 y.o., male   MRN/ DOB: [id number], Jan 29, 1951     PCP: [name], MD   Reason for Endocrinology Evaluation: Type 2 Diabetes Mellitus  Initial Endocrine Consultative Visit: 12/30/2018    PATIENT IDENTIFIER: [name] is a 70 y.o. male with a past medical history of  HTN, PE and T2DM. The patient has followed with Endocrinology clinic since 12/30/2018 for consultative assistance with management of his diabetes.  DIABETIC HISTORY:   Mr. [name] was diagnosed with T2DM in August, 2018 when he was admitted for hyperosmolar hyperglycemia with a BG > 1000 mg/dL. At the time of discharge he was on insulin and subsequently was able to stop insulin. His hemoglobin A1c has ranged from 5.1%in  10/2017, peaking at 14.5% in 03/2017.   Januvia stopped and Metformin started 12/2018 with an A1c of 5.7%  He was lost to follow up until 10/2020 when he returned with A1c 10.8 % insulin was started   Basal insulin stopped 12/2020    SUBJECTIVE:   During the last visit (01/07/2021): A1c 10.8% we started Metformin and stopped Janumet      Today (02/06/2021): Mr. [name] is here for a follow up on diabetes. He is accompanied by his spouse.  He checks his blood sugars multiple  times daily, through CGM. The patient has noted  had hypoglycemic episodes since the last clinic visit.  Denies nausea or diarrhea      HOME DIABETES REGIMEN:  Metformin 500 XR 2 tabs BID     Statin: yes ACE-I/ARB: Yes    CONTINUOUS GLUCOSE MONITORING RECORD INTERPRETATION    Dates of Recording: 5/18-6/14/2022 Sensor description:freestyle libre  Results statistics:   CGM use % of time 36  Average and SD 106/31.9  Time in range    87 %  % Time Above 180 5  % Time above 250 0  % Time Below target 8    Glycemic patterns summary: Bg's optimal except for afternoon hyperglycemia   Hyperglycemic episodes after breakfast  Hypoglycemic episodes occurred  rare  Overnight periods: Optimal         DIABETIC COMPLICATIONS: Microvascular complications:   Denies: neuropathy, retinopathy, CKD Last Eye Exam: Completed 11/14/2020  Macrovascular complications:   Denies: CAD, CVA, PVD   HISTORY:  Past Medical History:  Past Medical History:  Diagnosis Date   Acute pulmonary embolism (HCC) 04/08/2017   Altered mental status 03/28/2017   history of, for 3 days   Atelectasis 04/08/2017   mild right basilar noted on CT chest   Bladder calculi 03/29/2017   Large noted on US renal   Bladder stones    CHF (congestive heart failure) (HCC)    per ECHO   DKA (diabetic ketoacidoses) 03/28/2017   HHS/DKA; hospitalized for 3 days/notes 04/08/2017   DVT (deep venous thrombosis) (HCC)    Unsure if this is superficial thrombophlebitis, happened in ~2010 in [place]   Dyspnea 03/2017   per ECHO   History of kidney stones    HTN (hypertension)    Mass 2004   Left side of chest, massive infection per CT   Nonischemic cardiomyopathy (HCC)    per ECHO   Pneumonia 2011   Pulmonary infarct (HCC) 04/08/2017   left lower lung lobe noted on CT of chest   Type II diabetes mellitus (HCC)    "dx'd 03/28/2017"   Past Surgical History:  Past Surgical History:  Procedure Laterality Date   COLONOSCOPY     CYSTOSCOPY  WITH LITHOLAPAXY N/A 09/07/2017   Procedure: CYSTOSCOPY WITH LITHOLAPAXY;  Surgeon: Ihor Gully, MD;  Location: [facility];  Service: Urology;  Laterality: N/A;   TONSILLECTOMY  1960   Social History:  reports that he quit smoking about 6 years ago. His smoking use included cigarettes. He has a 4.80 pack-year smoking history. He has never used smokeless tobacco. He reports current alcohol use. He reports that he does not use drugs. Family History:  Family History  Problem Relation Age of Onset   Heart failure Father    Diabetes Sister      HOME MEDICATIONS: Allergies as of 02/06/2021       Reactions   Amlodipine Other (See Comments)    "Feet Swelling"   Penicillins Other (See Comments)   Unknown Has patient had a PCN reaction causing immediate rash, facial/tongue/throat swelling, SOB or lightheadedness with hypotension: Unknown Has patient had a PCN reaction causing severe rash involving mucus membranes or skin necrosis: Unknown Has patient had a PCN reaction that required hospitalization: Unknown Has patient had a PCN reaction occurring within the last 10 years: No If all of the above answers are "NO", then may proceed with Cephalosporin use.        Medication List        Accurate as of February 06, 2021  9:29 AM. If you have any questions, ask your nurse or doctor.          FreeStyle Libre 14 Day Sensor Misc 1 application by Other route every 14 (fourteen) days.   losartan-hydrochlorothiazide 100-12.5 MG tablet Commonly known as: HYZAAR Take 0.5 tablets by mouth daily.   metFORMIN 500 MG 24 hr tablet Commonly known as: GLUCOPHAGE-XR Take 2 tablets (1,000 mg total) by mouth 2 (two) times daily.   OneTouch Ultra test strip Generic drug: glucose blood 1 each by Other route daily. Use as instructed to check blood sugars daily Dx code E11.9   Rosuvastatin Calcium 5 MG Cpsp         OBJECTIVE:   Vital Signs: BP (!) 142/88   Pulse 88   Ht 5\' 7"  (1.702 m)   Wt 185 lb (83.9 kg)   SpO2 95%   BMI 28.98 kg/m   Wt Readings from Last 3 Encounters:  02/06/21 185 lb (83.9 kg)  01/07/21 186 lb (84.4 kg)  11/01/20 183 lb 8 oz (83.2 kg)     Exam: General: Pt appears well and is in NAD  Lungs: Clear with good BS bilat with no rales, rhonchi, or wheezes  Heart: RRR  Abdomen: Normoactive bowel sounds, soft, nontender, without masses or organomegaly palpable  Extremities: No pretibial edema  Neuro: MS is good with appropriate affect, pt is alert and Ox3   DM Foot Exam 01/07/2021  The skin of the feet is intact without sores or ulcerations. The pedal pulses are 2+ on right and 2+ on left. The sensation  is intact to a screening 5.07, 10 gram monofilament bilaterally    DATA REVIEWED:  Lab Results  Component Value Date   HGBA1C 6.6 (A) 01/07/2021   HGBA1C 10.8 (A) 11/01/2020   HGBA1C 5.7 (A) 10/01/2018   Results for BRON, SNELLINGS (MRN Sadie Haber) as of 01/08/2021 13:07  Ref. Range 01/07/2021 16:32  Sodium Latest Ref Range: 135 - 145 mEq/L 140  Potassium Latest Ref Range: 3.5 - 5.1 mEq/L 4.1  Chloride Latest Ref Range: 96 - 112 mEq/L 103  CO2 Latest Ref Range: 19 - 32 mEq/L 31  Glucose Latest Ref  Range: 70 - 99 mg/dL 89  BUN Latest Ref Range: 6 - 23 mg/dL 18  Creatinine Latest Ref Range: 0.40 - 1.50 mg/dL 2.22  Calcium Latest Ref Range: 8.4 - 10.5 mg/dL 9.8  GFR Latest Ref Range: >60.00 mL/min 79.51  MICROALB/CREAT RATIO Latest Ref Range: 0.0 - 30.0 mg/g 9.4  Creatinine,U Latest Units: mg/dL 979.8  Microalb, Ur Latest Ref Range: 0.0 - 1.9 mg/dL 92.1 (H)     ASSESSMENT / PLAN / RECOMMENDATIONS:   1) Type 2 Diabetes Mellitus, with recurrent Hypoglycemia, Without complications - Most recent A1c of 6.6 %. Goal A1c < 7.0 %.     -I have reviewed his CGM download, 87% of his readings are at goal, I have praised the patient on improved glycemic control he has been without insulin for a month without worsening hyperglycemia.  He will remain off insulin I have encouraged him to do a fingerstick if the CGM shows glucose readings less than 70 just to confirm accuracy   MEDICATIONS: Continue  Metformin 500 mg 2 tablets with Breakfast and Supper    EDUCATION / INSTRUCTIONS: BG monitoring instructions: Patient is instructed to check his blood sugars 3 times a day, before meals . Call [place] [facility] if: BG persistently < 70  I reviewed the Rule of 15 for the treatment of hypoglycemia in detail with the patient. Literature supplied.   2) Diabetic complications:  Eye: Does not have known diabetic retinopathy.  Neuro/ Feet: Does not have known diabetic peripheral  neuropathy .  Renal: Patient does not have known baseline CKD. He   is not on an ACEI/ARB at present.     Follow-up in 4 months  Signed electronically by: Lyndle Herrlich, MD  [facility] Endocrinology  [facility] [address] [place], [place] [address] Phone: [phone] FAX: [phone]   CC: [name], MD [address] [place] [address] Phone: [phone]  Fax: [phone]  Return to Endocrinology clinic as below: Future Appointments  Date Time Provider Department Center  11/14/2021  8:00 AM Rennis Chris, MD [facility] None

## 2021-02-13 DIAGNOSIS — Z1211 Encounter for screening for malignant neoplasm of colon: Secondary | ICD-10-CM | POA: Diagnosis not present

## 2021-02-13 DIAGNOSIS — D563 Thalassemia minor: Secondary | ICD-10-CM | POA: Diagnosis not present

## 2021-02-13 DIAGNOSIS — I517 Cardiomegaly: Secondary | ICD-10-CM | POA: Diagnosis not present

## 2021-02-13 DIAGNOSIS — Z86711 Personal history of pulmonary embolism: Secondary | ICD-10-CM | POA: Diagnosis not present

## 2021-02-13 DIAGNOSIS — E1169 Type 2 diabetes mellitus with other specified complication: Secondary | ICD-10-CM | POA: Diagnosis not present

## 2021-02-13 DIAGNOSIS — N400 Enlarged prostate without lower urinary tract symptoms: Secondary | ICD-10-CM | POA: Diagnosis not present

## 2021-02-13 DIAGNOSIS — I10 Essential (primary) hypertension: Secondary | ICD-10-CM | POA: Diagnosis not present

## 2021-02-13 DIAGNOSIS — Z0000 Encounter for general adult medical examination without abnormal findings: Secondary | ICD-10-CM | POA: Diagnosis not present

## 2021-02-13 DIAGNOSIS — E7800 Pure hypercholesterolemia, unspecified: Secondary | ICD-10-CM | POA: Diagnosis not present

## 2021-02-13 DIAGNOSIS — Z79899 Other long term (current) drug therapy: Secondary | ICD-10-CM | POA: Diagnosis not present

## 2021-02-13 DIAGNOSIS — N210 Calculus in bladder: Secondary | ICD-10-CM | POA: Diagnosis not present

## 2021-02-13 DIAGNOSIS — Z125 Encounter for screening for malignant neoplasm of prostate: Secondary | ICD-10-CM | POA: Diagnosis not present

## 2021-02-13 DIAGNOSIS — Z23 Encounter for immunization: Secondary | ICD-10-CM | POA: Diagnosis not present

## 2021-02-13 DIAGNOSIS — Z7984 Long term (current) use of oral hypoglycemic drugs: Secondary | ICD-10-CM | POA: Diagnosis not present

## 2021-04-10 DIAGNOSIS — I10 Essential (primary) hypertension: Secondary | ICD-10-CM | POA: Diagnosis not present

## 2021-04-12 ENCOUNTER — Encounter: Payer: Self-pay | Admitting: Internal Medicine

## 2021-04-15 ENCOUNTER — Other Ambulatory Visit: Payer: Self-pay | Admitting: Internal Medicine

## 2021-04-15 MED ORDER — FREESTYLE LIBRE 14 DAY SENSOR MISC: 1.0000 "application " | 3 refills | Status: DC

## 2021-04-17 ENCOUNTER — Encounter: Payer: Self-pay | Admitting: Internal Medicine

## 2021-04-17 NOTE — Telephone Encounter (Signed)
Called and message with Dr Paulino Rily  CMA to confirm  if patient  is to d/c the metformin medication , requested a call back or to fax over office notes regarding this.

## 2021-05-07 ENCOUNTER — Telehealth: Payer: Self-pay | Admitting: Internal Medicine

## 2021-05-07 NOTE — Telephone Encounter (Signed)
Received records from [facility] forwarded 4 pages to Dr. Lonzo Cloud 9/13/fbg

## 2021-06-12 ENCOUNTER — Other Ambulatory Visit: Payer: Self-pay

## 2021-06-12 ENCOUNTER — Ambulatory Visit (INDEPENDENT_AMBULATORY_CARE_PROVIDER_SITE_OTHER): Payer: PPO | Admitting: Internal Medicine

## 2021-06-12 ENCOUNTER — Encounter: Payer: Self-pay | Admitting: Internal Medicine

## 2021-06-12 VITALS — BP 126/78 | HR 94 | Ht 67.0 in | Wt 185.4 lb

## 2021-06-12 DIAGNOSIS — E119 Type 2 diabetes mellitus without complications: Secondary | ICD-10-CM | POA: Diagnosis not present

## 2021-06-12 NOTE — Progress Notes (Signed)
Name: Todd Avila  Age/ Sex: 70 y.o., male   MRN/ DOB: [id number], [birth year]     PCP: [name], MD   Reason for Endocrinology Evaluation: Type 2 Diabetes Mellitus  Initial Endocrine Consultative Visit: 12/30/2018    PATIENT IDENTIFIER: [name] is a 70 y.o. male with a past medical history of  HTN, PE and T2DM. The patient has followed with Endocrinology clinic since 12/30/2018 for consultative assistance with management of his diabetes.  DIABETIC HISTORY:   [name] was diagnosed with T2DM in August, 2018 when he was admitted for hyperosmolar hyperglycemia with a BG > 1000 mg/dL. At the time of discharge he was on insulin and subsequently was able to stop insulin. His hemoglobin A1c has ranged from 5.1%in  10/2017, peaking at 14.5% in 03/2017.   Januvia stopped and Metformin started 12/2018 with an A1c of 5.7%  He was lost to follow up until 10/2020 when he returned with A1c 10.8 % insulin was started   Basal insulin stopped 12/2020 Stopped janumet 01/2021  PCP stopped metformin 03/2021 due to euglycemia     SUBJECTIVE:   During the last visit (02/06/2021): A1c 6.6 % we continued Metformin     Today (06/12/2021): Todd Avila is here for a follow up on diabetes.   He checks his blood sugars multiple  times daily, through CGM. The patient has noted  had hypoglycemic episodes since the last clinic visit.   Weight has been stable  Denies nausea or diarrhea  Sister on Dialysis   HOME DIABETES REGIMEN:  Metformin 500 XR 2 tabs BID- not taking     Statin: yes ACE-I/ARB: Yes    CONTINUOUS GLUCOSE MONITORING RECORD INTERPRETATION    Dates of Recording: 9/22-10/19/2022 Sensor description:freestyle libre  Results statistics:   CGM use % of time 35  Average and SD 124/28  Time in range    91%  % Time Above 180 8  % Time above 250 0  % Time Below target 1    Glycemic patterns summary: Bg's optimal during the day and night   Hyperglycemic  episodes after breakfast  Hypoglycemic episodes occurred N/A   Overnight periods: Optimal     DIABETIC COMPLICATIONS: Microvascular complications:   Denies: neuropathy, retinopathy, CKD Last Eye Exam: Completed 11/14/2020  Macrovascular complications:   Denies: CAD, CVA, PVD   HISTORY:  Past Medical History:  Past Medical History:  Diagnosis Date   Acute pulmonary embolism (HCC) 04/08/2017   Altered mental status 03/28/2017   history of, for 3 days   Atelectasis 04/08/2017   mild right basilar noted on CT chest   Bladder calculi 03/29/2017   Large noted on US renal   Bladder stones    CHF (congestive heart failure) (HCC)    per ECHO   DKA (diabetic ketoacidoses) 03/28/2017   HHS/DKA; hospitalized for 3 days/notes 04/08/2017   DVT (deep venous thrombosis) (HCC)    Unsure if this is superficial thrombophlebitis, happened in ~2010 in [place]   Dyspnea 03/2017   per ECHO   History of kidney stones    HTN (hypertension)    Mass 2004   Left side of chest, massive infection per CT   Nonischemic cardiomyopathy (HCC)    per ECHO   Pneumonia 2011   Pulmonary infarct (HCC) 04/08/2017   left lower lung lobe noted on CT of chest   Type II diabetes mellitus (HCC)    "dx'd 03/28/2017"   Past Surgical History:  Past Surgical History:  Procedure  Laterality Date   COLONOSCOPY     CYSTOSCOPY WITH LITHOLAPAXY N/A 09/07/2017   Procedure: CYSTOSCOPY WITH LITHOLAPAXY;  Surgeon: Ihor Gully, MD;  Location: [facility];  Service: Urology;  Laterality: N/A;   TONSILLECTOMY  1960   Social History:  reports that he quit smoking about 6 years ago. His smoking use included cigarettes. He has a 4.80 pack-year smoking history. He has never used smokeless tobacco. He reports current alcohol use. He reports that he does not use drugs. Family History:  Family History  Problem Relation Age of Onset   Heart failure Father    Diabetes Sister      HOME MEDICATIONS: Allergies as of 06/12/2021        Reactions   Amlodipine Other (See Comments)   "Feet Swelling"   Penicillins Other (See Comments)   Unknown Has patient had a PCN reaction causing immediate rash, facial/tongue/throat swelling, SOB or lightheadedness with hypotension: Unknown Has patient had a PCN reaction causing severe rash involving mucus membranes or skin necrosis: Unknown Has patient had a PCN reaction that required hospitalization: Unknown Has patient had a PCN reaction occurring within the last 10 years: No If all of the above answers are "NO", then may proceed with Cephalosporin use.        Medication List        Accurate as of June 12, 2021  9:28 AM. If you have any questions, ask your nurse or doctor.          STOP taking these medications    metFORMIN 500 MG 24 hr tablet Commonly known as: GLUCOPHAGE-XR Stopped by: Scarlette Shorts, MD       TAKE these medications    FreeStyle Libre 14 Day Sensor Misc 1 application by Other route every 14 (fourteen) days.   losartan-hydrochlorothiazide 100-12.5 MG tablet Commonly known as: HYZAAR Take 0.5 tablets by mouth daily.   OneTouch Ultra test strip Generic drug: glucose blood 1 each by Other route daily. Use as instructed to check blood sugars daily Dx code E11.9   Rosuvastatin Calcium 5 MG Cpsp         OBJECTIVE:   Vital Signs: BP 126/78 (BP Location: Left Arm, Patient Position: Sitting, Cuff Size: Small)   Pulse 94   Ht 5\' 7"  (1.702 m)   Wt 185 lb 6.4 oz (84.1 kg)   SpO2 96%   BMI 29.04 kg/m   Wt Readings from Last 3 Encounters:  06/12/21 185 lb 6.4 oz (84.1 kg)  02/06/21 185 lb (83.9 kg)  01/07/21 186 lb (84.4 kg)     Exam: General: Pt appears well and is in NAD  Lungs: Clear with good BS bilat with no rales, rhonchi, or wheezes  Heart: RRR  Abdomen: Normoactive bowel sounds, soft, nontender, without masses or organomegaly palpable  Extremities: No pretibial edema  Neuro: MS is good with appropriate affect, pt is  alert and Ox3   DM Foot Exam 01/07/2021  The skin of the feet is intact without sores or ulcerations. The pedal pulses are 2+ on right and 2+ on left. The sensation is intact to a screening 5.07, 10 gram monofilament bilaterally    DATA REVIEWED:  Lab Results  Component Value Date   HGBA1C 6.6 (A) 01/07/2021   HGBA1C 10.8 (A) 11/01/2020   HGBA1C 5.7 (A) 10/01/2018   Results for BREYDON, SENTERS (MRN Sadie Haber) as of 01/08/2021 13:07  Ref. Range 01/07/2021 16:32  Sodium Latest Ref Range: 135 - 145 mEq/L 140  Potassium  Latest Ref Range: 3.5 - 5.1 mEq/L 4.1  Chloride Latest Ref Range: 96 - 112 mEq/L 103  CO2 Latest Ref Range: 19 - 32 mEq/L 31  Glucose Latest Ref Range: 70 - 99 mg/dL 89  BUN Latest Ref Range: 6 - 23 mg/dL 18  Creatinine Latest Ref Range: 0.40 - 1.50 mg/dL 0.86  Calcium Latest Ref Range: 8.4 - 10.5 mg/dL 9.8  GFR Latest Ref Range: >60.00 mL/min 79.51  MICROALB/CREAT RATIO Latest Ref Range: 0.0 - 30.0 mg/g 9.4  Creatinine,U Latest Units: mg/dL 578.4  Microalb, Ur Latest Ref Range: 0.0 - 1.9 mg/dL 69.6 (H)     ASSESSMENT / PLAN / RECOMMENDATIONS:   1) Type 2 Diabetes Mellitus, with recurrent Hypoglycemia, Without complications - Most recent A1c of 4.9  %. Goal A1c < 7.0 %.     -I have reviewed his CGM download, 91 % of his readings are at goal, I have praised the patient on improved glycemic control . He has been off Metformin for ~ 2 months and will remain off  - Pt to continue to monitor BG's and notify us with hyperglycemia    MEDICATIONS: N/A   EDUCATION / INSTRUCTIONS: BG monitoring instructions: Patient is instructed to check his blood sugars 3 times a day, before meals . Call [place] [facility] clinic if: BG persistently < 70  I reviewed the Rule of 15 for the treatment of hypoglycemia in detail with the patient. Literature supplied.   2) Diabetic complications:  Eye: Does not have known diabetic retinopathy.  Neuro/ Feet: Does not have known  diabetic peripheral neuropathy .  Renal: Patient does not have known baseline CKD. He   is not on an ACEI/ARB at present.     Follow-up in 4 months  Signed electronically by: Lyndle Herrlich, MD  [facility]  [facility] [address] [place], [place] [address] Phone: [phone] FAX: [phone]   CC: [name], MD [address] [place] [address] Phone: [phone]  Fax: [phone]  Return to Endocrinology clinic as below: Future Appointments  Date Time Provider Department Center  11/14/2021  8:00 AM Rennis Chris, MD [facility] None  12/12/2021  9:10 AM Alleyne Lac, Konrad Dolores, MD [id number] None

## 2021-06-12 NOTE — Patient Instructions (Signed)
Keep Up the Good Work ! 

## 2021-07-23 DIAGNOSIS — H2511 Age-related nuclear cataract, right eye: Secondary | ICD-10-CM | POA: Diagnosis not present

## 2021-07-23 DIAGNOSIS — H25043 Posterior subcapsular polar age-related cataract, bilateral: Secondary | ICD-10-CM | POA: Diagnosis not present

## 2021-07-23 DIAGNOSIS — H25013 Cortical age-related cataract, bilateral: Secondary | ICD-10-CM | POA: Diagnosis not present

## 2021-07-23 DIAGNOSIS — H18413 Arcus senilis, bilateral: Secondary | ICD-10-CM | POA: Diagnosis not present

## 2021-07-23 DIAGNOSIS — H40013 Open angle with borderline findings, low risk, bilateral: Secondary | ICD-10-CM | POA: Diagnosis not present

## 2021-07-23 DIAGNOSIS — H2513 Age-related nuclear cataract, bilateral: Secondary | ICD-10-CM | POA: Diagnosis not present

## 2021-10-22 DIAGNOSIS — Z87891 Personal history of nicotine dependence: Secondary | ICD-10-CM | POA: Diagnosis not present

## 2021-10-22 DIAGNOSIS — I10 Essential (primary) hypertension: Secondary | ICD-10-CM | POA: Diagnosis not present

## 2021-11-04 DIAGNOSIS — H2511 Age-related nuclear cataract, right eye: Secondary | ICD-10-CM | POA: Diagnosis not present

## 2021-11-05 DIAGNOSIS — H2512 Age-related nuclear cataract, left eye: Secondary | ICD-10-CM | POA: Diagnosis not present

## 2021-11-14 ENCOUNTER — Encounter (INDEPENDENT_AMBULATORY_CARE_PROVIDER_SITE_OTHER): Payer: PPO | Admitting: Ophthalmology

## 2021-12-12 ENCOUNTER — Ambulatory Visit: Payer: PPO | Admitting: Internal Medicine

## 2021-12-12 ENCOUNTER — Encounter: Payer: Self-pay | Admitting: Internal Medicine

## 2021-12-12 VITALS — BP 120/70 | HR 78 | Ht 67.0 in | Wt 181.0 lb

## 2021-12-12 DIAGNOSIS — R739 Hyperglycemia, unspecified: Secondary | ICD-10-CM

## 2021-12-12 LAB — POCT GLYCOSYLATED HEMOGLOBIN (HGB A1C): Hemoglobin A1C: 7.9 % — AB (ref 4.0–5.6)

## 2021-12-12 MED ORDER — METFORMIN HCL ER 500 MG PO TB24: 500.0000 mg | ORAL_TABLET | Freq: Every day | ORAL | 1 refills | Status: DC

## 2021-12-12 MED ORDER — FREESTYLE LIBRE 3 SENSOR MISC: 1.0000 | 3 refills | Status: DC

## 2021-12-12 NOTE — Progress Notes (Signed)
?Name: Todd Avila  ?Age/ Sex: 71 y.o., male   ?MRN/ DOB: [id number], February 26, 1951    ? ?PCP: Jonathon Jordan, MD   ?Reason for Endocrinology Evaluation: Type 2 Diabetes Mellitus  ?Initial Endocrine Consultative Visit: 12/30/2018  ? ? ?PATIENT IDENTIFIER: [name] is a 71 y.o. male with a past medical history of  HTN, PE and T2DM. The patient has followed with Endocrinology clinic since 12/30/2018 for consultative assistance with management of his diabetes. ? ?DIABETIC HISTORY:  ? ?Mr. Delhoyo was diagnosed with T2DM in August, 2018 when he was admitted for hyperosmolar hyperglycemia with a BG > 1000 mg/dL. At the time of discharge he was on insulin and subsequently was able to stop insulin. His hemoglobin A1c has ranged from 5.1%in  10/2017, peaking at 14.5% in 03/2017. ?  ?Januvia stopped and Metformin started 12/2018 with an A1c of 5.7% ? ?He was lost to follow up until 10/2020 when he returned with A1c 10.8 % insulin was started  ? ?Basal insulin stopped 12/2020 ?Stopped janumet 01/2021  ?PCP stopped metformin 03/2021 due to euglycemia  ? ?  ?SUBJECTIVE:  ? ?During the last visit (06/12/2021): A1c 4.9 % remained off metformin ? ? ? ? ?Today (12/12/2021): [name] is here for a follow up on diabetes.   He checks his blood sugars multiple  times daily, through CGM. The patient has noted  had hypoglycemic episodes since the last clinic visit. ? ? ? ?Denies nausea or diarrhea  ?Sister on Dialysis  ? ?Had right cataract sx 10/2021 , schedule for left eye tomorrow  ? ?HOME DIABETES REGIMEN:  ?N/A ? ? ? ?Statin: yes ?ACE-I/ARB: Yes ? ? ? ?CONTINUOUS GLUCOSE MONITORING RECORD INTERPRETATION   ? ?Dates of Recording:3/23-4/19/2023 ?Sensor description:freestyle libre ? ?Results statistics: ?  ?CGM use % of time 30  ?Average and SD 200/25.6  ?Time in range   42%  ?% Time Above 180 38  ?% Time above 250 20  ?% Time Below target 0  ? ? ?Glycemic patterns summary: Bg's optimal during the night and increase during the  day  ? ?Hyperglycemic episodes postprandial  ? ?Hypoglycemic episodes occurred N/A  ? ?Overnight periods: Optimal ? ? ? ? ?DIABETIC COMPLICATIONS: ?Microvascular complications:  ? ?Denies: neuropathy, retinopathy, CKD ?Last Eye Exam: Completed 11/14/2020 ? ?Macrovascular complications:  ? ?Denies: CAD, CVA, PVD ? ? ?HISTORY:  ?Past Medical History:  ?Past Medical History:  ?Diagnosis Date  ? Acute pulmonary embolism ([facility]) 04/08/2017  ? Altered mental status 03/28/2017  ? history of, for 3 days  ? Atelectasis 04/08/2017  ? mild right basilar noted on CT chest  ? Bladder calculi 03/29/2017  ? Large noted on US renal  ? Bladder stones   ? CHF (congestive heart failure) ([facility])   ? per ECHO  ? DKA (diabetic ketoacidoses) 03/28/2017  ? HHS/DKA; hospitalized for 3 days/notes 04/08/2017  ? DVT (deep venous thrombosis) ([facility])   ? Unsure if this is superficial thrombophlebitis, happened in ~2010 in [place]  ? Dyspnea 03/2017  ? per ECHO  ? History of kidney stones   ? HTN (hypertension)   ? Mass 2004  ? Left side of chest, massive infection per CT  ? Nonischemic cardiomyopathy ([place] [facility])   ? per ECHO  ? Pneumonia 2011  ? Pulmonary infarct ([facility]) 04/08/2017  ? left lower lung lobe noted on CT of chest  ? Type II diabetes mellitus ([facility])   ? "dx'd 03/28/2017"  ? ?Past Surgical History:  ?Past Surgical History:  ?  Procedure Laterality Date  ? COLONOSCOPY    ? CYSTOSCOPY WITH LITHOLAPAXY N/A 09/07/2017  ? Procedure: CYSTOSCOPY WITH LITHOLAPAXY;  Surgeon: Kathie Rhodes, MD;  Location: [facility];  Service: Urology;  Laterality: N/A;  ? TONSILLECTOMY  1960  ? ?Social History:  reports that he quit smoking about 7 years ago. His smoking use included cigarettes. He has a 4.80 pack-year smoking history. He has never used smokeless tobacco. He reports current alcohol use. He reports that he does not use drugs. ?Family History:  ?Family History  ?Problem Relation Age of Onset  ? Heart failure Father   ? Diabetes Sister   ? ? ? ?HOME MEDICATIONS: ?Allergies as  of 12/12/2021   ? ?   Reactions  ? Amlodipine Other (See Comments)  ? "Feet Swelling"  ? Penicillins Other (See Comments)  ? Unknown ?Has patient had a PCN reaction causing immediate rash, facial/tongue/throat swelling, SOB or lightheadedness with hypotension: Unknown ?Has patient had a PCN reaction causing severe rash involving mucus membranes or skin necrosis: Unknown ?Has patient had a PCN reaction that required hospitalization: Unknown ?Has patient had a PCN reaction occurring within the last 10 years: No ?If all of the above answers are "NO", then may proceed with Cephalosporin use.  ? ?  ? ?  ?Medication List  ?  ? ?  ? Accurate as of December 12, 2021  9:23 AM. If you have any questions, ask your nurse or doctor.  ?  ?  ? ?  ? ?FreeStyle Libre 3 Sensor Misc ?1 Device by Does not apply route every 14 (fourteen) days. ?What changed:  ?how much to take ?how to take this ?Changed by: Dorita Sciara, MD ?  ?losartan-hydrochlorothiazide 100-12.5 MG tablet ?Commonly known as: HYZAAR ?Take 0.5 tablets by mouth daily. ?  ?metFORMIN 500 MG 24 hr tablet ?Commonly known as: GLUCOPHAGE-XR ?Take 1 tablet (500 mg total) by mouth daily with breakfast. ?Started by: Dorita Sciara, MD ?  ?OneTouch Ultra test strip ?Generic drug: glucose blood ?1 each by Other route daily. Use as instructed to check blood sugars daily Dx code E11.9 ?  ?Rosuvastatin Calcium 5 MG Cpsp ?  ? ?  ? ? ? ?OBJECTIVE:  ? ?Vital Signs: BP 120/70 (BP Location: Left Arm, Patient Position: Sitting, Cuff Size: Small)   Pulse 78   Ht 5\' 7"  (1.702 m)   Wt 181 lb (82.1 kg)   SpO2 96%   BMI 28.35 kg/m?   ?Wt Readings from Last 3 Encounters:  ?12/12/21 181 lb (82.1 kg)  ?06/12/21 185 lb 6.4 oz (84.1 kg)  ?02/06/21 185 lb (83.9 kg)  ? ? ? ?Exam: ?General: Pt appears well and is in NAD  ?Lungs: Clear with good BS bilat with no rales, rhonchi, or wheezes  ?Heart: RRR  ?Abdomen: Normoactive bowel sounds, soft, nontender, without masses or organomegaly  palpable  ?Extremities: No pretibial edema  ?Neuro: MS is good with appropriate affect, pt is alert and Ox3  ? ?DM Foot Exam 12/12/2021 ? ?The skin of the feet is intact without sores or ulcerations. ?The pedal pulses are 2+ on right and 2+ on left. ?The sensation is intact to a screening 5.07, 10 gram monofilament bilaterally ? ? ? ?DATA REVIEWED: ? ?Lab Results  ?Component Value Date  ? HGBA1C 7.9 (A) 12/12/2021  ? HGBA1C 6.6 (A) 01/07/2021  ? HGBA1C 10.8 (A) 11/01/2020  ? ?Results for DAIEL, TESSMER (MRN [id number]) as of 01/08/2021 13:07 ? Ref. Range 01/07/2021 16:32  ?  Sodium Latest Ref Range: 135 - 145 mEq/L 140  ?Potassium Latest Ref Range: 3.5 - 5.1 mEq/L 4.1  ?Chloride Latest Ref Range: 96 - 112 mEq/L 103  ?CO2 Latest Ref Range: 19 - 32 mEq/L 31  ?Glucose Latest Ref Range: 70 - 99 mg/dL 89  ?BUN Latest Ref Range: 6 - 23 mg/dL 18  ?Creatinine Latest Ref Range: 0.40 - 1.50 mg/dL 0.97  ?Calcium Latest Ref Range: 8.4 - 10.5 mg/dL 9.8  ?GFR Latest Ref Range: >60.00 mL/min 79.51  ?MICROALB/CREAT RATIO Latest Ref Range: 0.0 - 30.0 mg/g 9.4  ?Creatinine,U Latest Units: mg/dL 157.8  ?Microalb, Ur Latest Ref Range: 0.0 - 1.9 mg/dL 14.9 (H)  ? ? ? ?ASSESSMENT / PLAN / RECOMMENDATIONS:  ? ?1) Type 2 Diabetes Mellitus, Sub-Optimal Hypoglycemia, Without complications - Most recent A1c of 7.9 %. Goal A1c < 7.0 %.   ? ?-Patient with hypoglycemia again ?-We discussed the importance of consistency with lifestyle changes and to choose a diet or lifestyle that he could be consistent with long-term.  He did start a part-time job and his eating habits have changed ?-Today we discussed glycemic agents to include metformin, Januvia, and SGLT2 inhibitors.  Patient opted to restart metformin at this time ? ?MEDICATIONS: ?Restart metformin 500 mg XR daily ? ? ?EDUCATION / INSTRUCTIONS: ?BG monitoring instructions: Patient is instructed to check his blood sugars 3 times a day, before meals . ?Call [facility] if: BG  persistently < 70  ?I reviewed the Rule of 15 for the treatment of hypoglycemia in detail with the patient. Literature supplied. ? ? ?2) Diabetic complications:  ?Eye: Does not have known diabetic retinopathy.  ?N

## 2021-12-12 NOTE — Patient Instructions (Signed)
-   Start Metformin 500 mg XR , 1 tablet daily  ? ? ?HOW TO TREAT LOW BLOOD SUGARS (Blood sugar LESS THAN 70 MG/DL) ?Please follow the RULE OF 15 for the treatment of hypoglycemia treatment (when your (blood sugars are less than 70 mg/dL)  ? ?STEP 1: Take 15 grams of carbohydrates when your blood sugar is low, which includes:  ?3-4 GLUCOSE TABS  OR ?3-4 OZ OF JUICE OR REGULAR SODA OR ?ONE TUBE OF GLUCOSE GEL   ? ?STEP 2: RECHECK blood sugar in 15 MINUTES ?STEP 3: If your blood sugar is still low at the 15 minute recheck --> then, go back to STEP 1 and treat AGAIN with another 15 grams of carbohydrates. ? ?

## 2021-12-13 ENCOUNTER — Encounter: Payer: Self-pay | Admitting: Internal Medicine

## 2021-12-13 DIAGNOSIS — H2512 Age-related nuclear cataract, left eye: Secondary | ICD-10-CM | POA: Diagnosis not present

## 2022-03-07 DIAGNOSIS — E7800 Pure hypercholesterolemia, unspecified: Secondary | ICD-10-CM | POA: Diagnosis not present

## 2022-03-07 DIAGNOSIS — N400 Enlarged prostate without lower urinary tract symptoms: Secondary | ICD-10-CM | POA: Diagnosis not present

## 2022-03-07 DIAGNOSIS — E1169 Type 2 diabetes mellitus with other specified complication: Secondary | ICD-10-CM | POA: Diagnosis not present

## 2022-03-07 DIAGNOSIS — Z79899 Other long term (current) drug therapy: Secondary | ICD-10-CM | POA: Diagnosis not present

## 2022-03-18 DIAGNOSIS — Z86711 Personal history of pulmonary embolism: Secondary | ICD-10-CM | POA: Diagnosis not present

## 2022-03-18 DIAGNOSIS — I517 Cardiomegaly: Secondary | ICD-10-CM | POA: Diagnosis not present

## 2022-03-18 DIAGNOSIS — Z1211 Encounter for screening for malignant neoplasm of colon: Secondary | ICD-10-CM | POA: Diagnosis not present

## 2022-03-18 DIAGNOSIS — E1165 Type 2 diabetes mellitus with hyperglycemia: Secondary | ICD-10-CM | POA: Diagnosis not present

## 2022-03-18 DIAGNOSIS — H35033 Hypertensive retinopathy, bilateral: Secondary | ICD-10-CM | POA: Diagnosis not present

## 2022-03-18 DIAGNOSIS — D563 Thalassemia minor: Secondary | ICD-10-CM | POA: Diagnosis not present

## 2022-03-18 DIAGNOSIS — N400 Enlarged prostate without lower urinary tract symptoms: Secondary | ICD-10-CM | POA: Diagnosis not present

## 2022-03-18 DIAGNOSIS — Z0000 Encounter for general adult medical examination without abnormal findings: Secondary | ICD-10-CM | POA: Diagnosis not present

## 2022-03-18 DIAGNOSIS — I10 Essential (primary) hypertension: Secondary | ICD-10-CM | POA: Diagnosis not present

## 2022-03-26 DIAGNOSIS — Z1211 Encounter for screening for malignant neoplasm of colon: Secondary | ICD-10-CM | POA: Diagnosis not present

## 2022-04-14 NOTE — Progress Notes (Unsigned)
Name: Todd Avila  Age/ Sex: 71 y.o., male   MRN/ DOB: [id number], 1951/07/03     PCP: [name], MD   Reason for Endocrinology Evaluation: Type 2 Diabetes Mellitus  Initial Endocrine Consultative Visit: 12/30/2018    PATIENT IDENTIFIER: Todd Avila is a 71 y.o. male with a past medical history of  HTN, PE and T2DM. The patient has followed with Endocrinology clinic since 12/30/2018 for consultative assistance with management of his diabetes.  DIABETIC HISTORY:   Todd Avila was diagnosed with T2DM in August, 2018 when he was admitted for hyperosmolar hyperglycemia with a BG > 1000 mg/dL. At the time of discharge he was on insulin and subsequently was able to stop insulin. His hemoglobin A1c has ranged from 5.1%in  10/2017, peaking at 14.5% in 03/2017.   Januvia stopped and Metformin started 12/2018 with an A1c of 5.7%  He was lost to follow up until 10/2020 when he returned with A1c 10.8 % insulin was started   Basal insulin stopped 12/2020 Stopped janumet 01/2021  PCP stopped metformin 03/2021 due to euglycemia     SUBJECTIVE:   During the last visit (12/12/2021): A1c 7.9 % restarted metformin   Today (04/15/2022): Todd Avila is here for a follow up on diabetes.He is accompanied by his spouse today.    He checks his blood sugars multiple  times daily, through CGM. The patient has noted hypoglycemic episodes since the last clinic visit.  Since his last visit he was seen by his PCP and with an A1c 8.0% , metformin was switched to Synjary 12-998 mg , currently on samples only   Denies frequency , or genital infection    Had right cataract sx 10/2021    In office 129 mg/dL  CGM 703 mg/dL    HOME DIABETES REGIMEN:  Synjardy XR 12-998 1 tablet daily     Statin: yes ACE-I/ARB: Yes    CONTINUOUS GLUCOSE MONITORING RECORD INTERPRETATION    Dates of Recording:7/26-8/22/2023 Sensor description:freestyle libre  Results statistics:   CGM use % of time 97   Average and SD 145/28  Time in range  79%  % Time Above 180 18  % Time above 250 1  % Time Below target 2    Glycemic patterns summary: Bg's optimal during the night and most of the day  Hyperglycemic episodes postprandial   Hypoglycemic episodes occurred at night   Overnight periods: trends down      DIABETIC COMPLICATIONS: Microvascular complications:   Denies: neuropathy, retinopathy, CKD Last Eye Exam: Completed 11/14/2020  Macrovascular complications:   Denies: CAD, CVA, PVD   HISTORY:  Past Medical History:  Past Medical History:  Diagnosis Date   Acute pulmonary embolism ([facility]) 04/08/2017   Altered mental status 03/28/2017   history of, for 3 days   Atelectasis 04/08/2017   mild right basilar noted on CT chest   Bladder calculi 03/29/2017   Large noted on US renal   Bladder stones    CHF (congestive heart failure) ([facility])    per ECHO   DKA (diabetic ketoacidoses) 03/28/2017   HHS/DKA; hospitalized for 3 days/notes 04/08/2017   DVT (deep venous thrombosis) ([facility])    Unsure if this is superficial thrombophlebitis, happened in ~2010 in [place]   Dyspnea 03/2017   per ECHO   History of kidney stones    HTN (hypertension)    Mass 2004   Left side of chest, massive infection per CT   Nonischemic cardiomyopathy ([facility])    per ECHO  Pneumonia 2011   Pulmonary infarct ([facility]) 04/08/2017   left lower lung lobe noted on CT of chest   Type II diabetes mellitus ([facility])    "dx'd 03/28/2017"   Past Surgical History:  Past Surgical History:  Procedure Laterality Date   COLONOSCOPY     CYSTOSCOPY WITH LITHOLAPAXY N/A 09/07/2017   Procedure: CYSTOSCOPY WITH LITHOLAPAXY;  Surgeon: Ihor Gully, MD;  Location: [facility];  Service: Urology;  Laterality: N/A;   TONSILLECTOMY  1960   Social History:  reports that he quit smoking about 7 years ago. His smoking use included cigarettes. He has a 4.80 pack-year smoking history. He has never used smokeless tobacco. He reports current  alcohol use. He reports that he does not use drugs. Family History:  Family History  Problem Relation Age of Onset   Heart failure Father    Diabetes Sister      HOME MEDICATIONS: Allergies as of 04/15/2022       Reactions   Amlodipine Other (See Comments)   "Feet Swelling"   Penicillins Other (See Comments)   Unknown Has patient had a PCN reaction causing immediate rash, facial/tongue/throat swelling, SOB or lightheadedness with hypotension: Unknown Has patient had a PCN reaction causing severe rash involving mucus membranes or skin necrosis: Unknown Has patient had a PCN reaction that required hospitalization: Unknown Has patient had a PCN reaction occurring within the last 10 years: No If all of the above answers are "NO", then may proceed with Cephalosporin use.        Medication List        Accurate as of April 15, 2022  9:20 AM. If you have any questions, ask your nurse or doctor.          STOP taking these medications    metFORMIN 500 MG 24 hr tablet Commonly known as: GLUCOPHAGE-XR Stopped by: Scarlette Shorts, MD   Synjardy XR 12-998 MG Tb24 Generic drug: Empagliflozin-metFORMIN HCl ER Replaced by: Kirk Ruths 12.12-998 MG Tabs Stopped by: Scarlette Shorts, MD       TAKE these medications    FreeStyle Libre 3 Sensor Misc 1 Device by Does not apply route every 14 (fourteen) days.   losartan-hydrochlorothiazide 100-12.5 MG tablet Commonly known as: HYZAAR Take 0.5 tablets by mouth daily.   OneTouch Ultra test strip Generic drug: glucose blood 1 each by Other route daily. Use as instructed to check blood sugars daily Dx code E11.9   Rosuvastatin Calcium 5 MG Cpsp   Synjardy 12.12-998 MG Tabs Generic drug: Empagliflozin-metFORMIN HCl Take 1 tablet by mouth daily in the afternoon. Replaces: Synjardy XR 12-998 MG Tb24 Started by: Scarlette Shorts, MD         OBJECTIVE:   Vital Signs: BP 124/80 (BP Location: Left Arm, Patient  Position: Sitting, Cuff Size: Small)   Pulse 74   Ht 5\' 7"  (1.702 m)   Wt 182 lb 3.2 oz (82.6 kg)   SpO2 96%   BMI 28.54 kg/m   Wt Readings from Last 3 Encounters:  04/15/22 182 lb 3.2 oz (82.6 kg)  12/12/21 181 lb (82.1 kg)  06/12/21 185 lb 6.4 oz (84.1 kg)     Exam: General: Pt appears well and is in NAD  Lungs: Clear with good BS bilat with no rales, rhonchi, or wheezes  Heart: RRR  Abdomen: Normoactive bowel sounds, soft, nontender, without masses or organomegaly palpable  Extremities: No pretibial edema  Neuro: MS is good with appropriate affect, pt is alert and Ox3  DM Foot Exam 12/12/2021  The skin of the feet is intact without sores or ulcerations. The pedal pulses are 2+ on right and 2+ on left. The sensation is intact to a screening 5.07, 10 gram monofilament bilaterally    DATA REVIEWED:  Lab Results  Component Value Date   HGBA1C 7.9 (A) 12/12/2021   HGBA1C 6.6 (A) 01/07/2021   HGBA1C 10.8 (A) 11/01/2020   03/07/2022 A1c 8.0% MA/CR ratio 11.8 BUN 16 Creatinine 0.94 GFR 87 TG 88 HDL 36 LDL 48   ASSESSMENT / PLAN / RECOMMENDATIONS:   1) Type 2 Diabetes Mellitus, poorly controlled , Without complications - Most recent A1c of 8.0 %. Goal A1c < 7.0 %.    -Patient with hypoglycemia again on freestyle libre, I am not sure how accurate these readings are and I have asked him to confirm hypoglycemic episodes with a finger stick when possible  - His in office BG was was comparable to CGM today but would like to check with extreme readings   -I saw him in 01/2021 and he was to stay on Metformin 2000 mg , but by his visit with me in 05/2021 he OFF metformin because per pt his PCP stopped it due to euglycemia, his A1c in 05/2021 was 4.9% . He returned in April 2023 with an A1c of 7.9 % and I restarted him on Metformin  but today he was started on Synjardy samples through his PCP. I emphasized the importance of continuous care with same provider as each Provider  has their own assessment and plan and it may differ from one provider to the other , I would like to keep his glucose control steady without the extreme fluctuations that has been happening over the past year. -I will continue Synjardy at this time, but the patient was encouraged to contact our office with any issues with his glucose control  MEDICATIONS: Take Synjardy 12.12-998 mg daily   EDUCATION / INSTRUCTIONS: BG monitoring instructions: Patient is instructed to check his blood sugars 3 times a day, before meals . Call [facility] if: BG persistently < 70  I reviewed the Rule of 15 for the treatment of hypoglycemia in detail with the patient. Literature supplied.   2) Diabetic complications:  Eye: Does not have known diabetic retinopathy.  Neuro/ Feet: Does not have known diabetic peripheral neuropathy .  Renal: Patient does not have known baseline CKD. He   is not on an ACEI/ARB at present.     Follow-up in 2 months  Signed electronically by: Lyndle Herrlich, MD  [facility]  [facility] [address] [place], [place] [address] Phone: [phone] FAX: [phone]   CC: [name], MD [address] [place] [address] Phone: [phone]  Fax: [phone]  Return to Endocrinology clinic as below: No future appointments.

## 2022-04-15 ENCOUNTER — Encounter: Payer: Self-pay | Admitting: Internal Medicine

## 2022-04-15 ENCOUNTER — Ambulatory Visit (INDEPENDENT_AMBULATORY_CARE_PROVIDER_SITE_OTHER): Payer: PPO | Admitting: Internal Medicine

## 2022-04-15 VITALS — BP 124/80 | HR 74 | Ht 67.0 in | Wt 182.2 lb

## 2022-04-15 DIAGNOSIS — E1165 Type 2 diabetes mellitus with hyperglycemia: Secondary | ICD-10-CM

## 2022-04-15 LAB — POCT GLUCOSE (DEVICE FOR HOME USE): Glucose Fasting, POC: 129 mg/dL — AB (ref 70–99)

## 2022-04-15 MED ORDER — SYNJARDY 12.5-1000 MG PO TABS: 1.0000 | ORAL_TABLET | Freq: Every day | ORAL | 1 refills | Status: DC

## 2022-04-15 NOTE — Patient Instructions (Addendum)
Continue Synjardy 12.12-998 mg daily

## 2022-04-16 ENCOUNTER — Encounter: Payer: Self-pay | Admitting: Internal Medicine

## 2022-04-17 ENCOUNTER — Telehealth: Payer: Self-pay

## 2022-04-17 NOTE — Telephone Encounter (Signed)
Patient LVM regarding Synjardy, patient stated that the pharmacy told him that they didn't receive the RX. Spoke with the pharmacy and they received the RX but states that the copay is $90. Informed patient of copay amount and he agrees to pay it.

## 2022-06-17 DIAGNOSIS — H01001 Unspecified blepharitis right upper eyelid: Secondary | ICD-10-CM | POA: Diagnosis not present

## 2022-06-17 DIAGNOSIS — H35363 Drusen (degenerative) of macula, bilateral: Secondary | ICD-10-CM | POA: Diagnosis not present

## 2022-06-17 DIAGNOSIS — H40013 Open angle with borderline findings, low risk, bilateral: Secondary | ICD-10-CM | POA: Diagnosis not present

## 2022-06-17 DIAGNOSIS — H26491 Other secondary cataract, right eye: Secondary | ICD-10-CM | POA: Diagnosis not present

## 2022-06-18 ENCOUNTER — Encounter: Payer: Self-pay | Admitting: Internal Medicine

## 2022-06-18 ENCOUNTER — Ambulatory Visit: Payer: PPO | Admitting: Internal Medicine

## 2022-06-18 VITALS — BP 130/84 | HR 69 | Ht 67.0 in | Wt 183.0 lb

## 2022-06-18 DIAGNOSIS — E119 Type 2 diabetes mellitus without complications: Secondary | ICD-10-CM

## 2022-06-18 LAB — POCT GLYCOSYLATED HEMOGLOBIN (HGB A1C): Hemoglobin A1C: 6.3 % — AB (ref 4.0–5.6)

## 2022-06-18 MED ORDER — SYNJARDY 12.5-1000 MG PO TABS: 1.0000 | ORAL_TABLET | Freq: Every day | ORAL | 3 refills | Status: DC

## 2022-06-18 NOTE — Progress Notes (Signed)
Name: Todd Avila  Age/ Sex: 71 y.o., male   MRN/ DOB: [id number], [birth year]     PCP: [name], MD   Reason for Endocrinology Evaluation: Type 2 Diabetes Mellitus  Initial Endocrine Consultative Visit: 12/30/2018    PATIENT IDENTIFIER: Todd Avila is a 71 y.o. male with a past medical history of  HTN, PE and T2DM. The patient has followed with Endocrinology clinic since 12/30/2018 for consultative assistance with management of his diabetes.  DIABETIC HISTORY:   Todd Avila was diagnosed with T2DM in August, 2018 when Todd Avila was admitted for hyperosmolar hyperglycemia with a BG > 1000 mg/dL. At the time of discharge Todd Avila was on insulin and subsequently was able to stop insulin. His hemoglobin A1c has ranged from 5.1%in  10/2017, peaking at 14.5% in 03/2017.   Januvia stopped and Metformin started 12/2018 with an A1c of 5.7%  Todd Avila was lost to follow up until 10/2020 when Todd Avila returned with A1c 10.8 % insulin was started   Basal insulin stopped 12/2020 Stopped janumet 01/2021  PCP stopped metformin 03/2021 due to euglycemia    His PCP has switched metformin to Synjardy by August 2023, which we continued   SUBJECTIVE:   During the last visit (12/12/2021): A1c 8.0 %   Today (06/18/2022): Todd Avila is here for a follow up on diabetes.Todd Avila is accompanied by his spouse today.    Todd Avila checks his blood sugars multiple  times daily, through CGM. The patient  noted hypoglycemic episodes since the last clinic visit on freestyle libre   Denies frequency , or genital infection    HOME DIABETES REGIMEN:  Synjardy XR 07-999  daily     Statin: yes ACE-I/ARB: Yes    CONTINUOUS GLUCOSE MONITORING RECORD INTERPRETATION    Dates of Recording:9/28-10/25/2023 Sensor description:freestyle libre  Results statistics:   CGM use % of time 99  Average and SD 127/31.3  Time in range  86 %  % Time Above 180 9  % Time above 250 1  % Time Below target 3    Glycemic patterns summary: Bg's  optimal during the night and the day  Hyperglycemic episodes postprandial   Hypoglycemic episodes occurred at night and during the day   Overnight periods: trends down      DIABETIC COMPLICATIONS: Microvascular complications:   Denies: neuropathy, retinopathy, CKD Last Eye Exam: Completed 10/2021  Macrovascular complications:   Denies: CAD, CVA, PVD   HISTORY:  Past Medical History:  Past Medical History:  Diagnosis Date   Acute pulmonary embolism (HCC) 04/08/2017   Altered mental status 03/28/2017   history of, for 3 days   Atelectasis 04/08/2017   mild right basilar noted on CT chest   Bladder calculi 03/29/2017   Large noted on US renal   Bladder stones    CHF (congestive heart failure) (HCC)    per ECHO   DKA (diabetic ketoacidoses) 03/28/2017   HHS/DKA; hospitalized for 3 days/notes 04/08/2017   DVT (deep venous thrombosis) (HCC)    Unsure if this is superficial thrombophlebitis, happened in ~2010 in [place]   Dyspnea 03/2017   per ECHO   History of kidney stones    HTN (hypertension)    Mass 2004   Left side of chest, massive infection per CT   Nonischemic cardiomyopathy (HCC)    per ECHO   Pneumonia 2011   Pulmonary infarct (HCC) 04/08/2017   left lower lung lobe noted on CT of chest   Type II diabetes mellitus (HCC)    "  dx'd 03/28/2017"   Past Surgical History:  Past Surgical History:  Procedure Laterality Date   COLONOSCOPY     CYSTOSCOPY WITH LITHOLAPAXY N/A 09/07/2017   Procedure: CYSTOSCOPY WITH LITHOLAPAXY;  Surgeon: Kathie Rhodes, MD;  Location: [facility];  Service: Urology;  Laterality: N/A;   TONSILLECTOMY  1960   Social History:  reports that Todd Avila quit smoking about 7 years ago. His smoking use included cigarettes. Todd Avila has a 4.80 pack-year smoking history. Todd Avila has never used smokeless tobacco. Todd Avila reports current alcohol use. Todd Avila reports that Todd Avila does not use drugs. Family History:  Family History  Problem Relation Age of Onset   Heart failure Father     Diabetes Sister      HOME MEDICATIONS: Allergies as of 06/18/2022       Reactions   Amlodipine Other (See Comments)   "Feet Swelling"   Penicillins Other (See Comments)   Unknown Has patient had a PCN reaction causing immediate rash, facial/tongue/throat swelling, SOB or lightheadedness with hypotension: Unknown Has patient had a PCN reaction causing severe rash involving mucus membranes or skin necrosis: Unknown Has patient had a PCN reaction that required hospitalization: Unknown Has patient had a PCN reaction occurring within the last 10 years: No If all of the above answers are "NO", then may proceed with Cephalosporin use.        Medication List        Accurate as of June 18, 2022  3:28 PM. If you have any questions, ask your nurse or doctor.          FreeStyle Libre 3 Sensor Misc 1 Device by Does not apply route every 14 (fourteen) days.   losartan-hydrochlorothiazide 100-12.5 MG tablet Commonly known as: HYZAAR Take 0.5 tablets by mouth daily.   OneTouch Ultra test strip Generic drug: glucose blood 1 each by Other route daily. Use as instructed to check blood sugars daily Dx code E11.9   Rosuvastatin Calcium 5 MG Cpsp   Synjardy 12.12-998 MG Tabs Generic drug: Empagliflozin-metFORMIN HCl Take 1 tablet by mouth daily in the afternoon.         OBJECTIVE:   Vital Signs: BP 130/84 (BP Location: Left Arm, Patient Position: Sitting, Cuff Size: Small)   Pulse 69   Ht 5\' 7"  (1.702 m)   Wt 183 lb (83 kg)   SpO2 99%   BMI 28.66 kg/m   Wt Readings from Last 3 Encounters:  06/18/22 183 lb (83 kg)  04/15/22 182 lb 3.2 oz (82.6 kg)  12/12/21 181 lb (82.1 kg)     Exam: General: Pt appears well and is in NAD  Lungs: Clear with good BS bilat with no rales, rhonchi, or wheezes  Heart: RRR  Abdomen: Normoactive bowel sounds, soft, nontender, without masses or organomegaly palpable  Extremities: No pretibial edema  Neuro: MS is good with appropriate  affect, pt is alert and Ox3   DM Foot Exam 12/12/2021  The skin of the feet is intact without sores or ulcerations. The pedal pulses are 2+ on right and 2+ on left. The sensation is intact to a screening 5.07, 10 gram monofilament bilaterally    DATA REVIEWED:  Lab Results  Component Value Date   HGBA1C 6.3 (A) 06/18/2022   HGBA1C 7.9 (A) 12/12/2021   HGBA1C 6.6 (A) 01/07/2021   03/07/2022 A1c 8.0% MA/CR ratio 11.8 BUN 16 Creatinine 0.94 GFR 87 TG 88 HDL 36 LDL 48   ASSESSMENT / PLAN / RECOMMENDATIONS:   1) Type 2 Diabetes  Mellitus, OPtimally controlled , Without complications - Most recent A1c of 6.3 %. Goal A1c < 7.0 %.    -A1c is down from 8.0% to 6.3% -Is not having any side effects to the medication and co-pay is doable -Todd Avila is having multiple hypoglycemic episodes on the freestyle libre, these have not been accurate, today Todd Avila was provided with a sample of Dexcom G7 to compare readings -Todd Avila is currently getting freestyle libre from his local pharmacy, we will consider switching to her DME supplier in the future if needed     MEDICATIONS: Continue Synjardy 12.12-998 mg daily   EDUCATION / INSTRUCTIONS: BG monitoring instructions: Patient is instructed to check his blood sugars 3 times a day, before meals . Call [place] [facility] if: BG persistently < 70  I reviewed the Rule of 15 for the treatment of hypoglycemia in detail with the patient. Literature supplied.   2) Diabetic complications:  Eye: Does not have known diabetic retinopathy.  Neuro/ Feet: Does not have known diabetic peripheral neuropathy .  Renal: Patient does not have known baseline CKD. Todd Avila   is not on an ACEI/ARB at present.     Follow-up in 4 months  Signed electronically by: Lyndle Herrlich, MD  [facility] Endocrinology  [facility] [address] [place], [place] [address] Phone: [phone] FAX: [phone]   CC: [name],  MD [address] [place] [address] Phone: [phone]  Fax: [phone]  Return to Endocrinology clinic as below: Future Appointments  Date Time Provider Department Center  10/22/2022  9:50 AM Evolett Somarriba, Konrad Dolores, MD [facility] None

## 2022-06-18 NOTE — Patient Instructions (Addendum)
Continue Synjardy 12.12-998 mg daily    HOW TO TREAT LOW BLOOD SUGARS (Blood sugar LESS THAN 70 MG/DL) Please follow the RULE OF 15 for the treatment of hypoglycemia treatment (when your (blood sugars are less than 70 mg/dL)   STEP 1: Take 15 grams of carbohydrates when your blood sugar is low, which includes:  3-4 GLUCOSE TABS  OR 3-4 OZ OF JUICE OR REGULAR SODA OR ONE TUBE OF GLUCOSE GEL    STEP 2: RECHECK blood sugar in 15 MINUTES STEP 3: If your blood sugar is still low at the 15 minute recheck --> then, go back to STEP 1 and treat AGAIN with another 15 grams of carbohydrates.

## 2022-06-19 ENCOUNTER — Encounter: Payer: Self-pay | Admitting: Internal Medicine

## 2022-07-10 ENCOUNTER — Encounter: Payer: Self-pay | Admitting: Internal Medicine

## 2022-07-29 DIAGNOSIS — I10 Essential (primary) hypertension: Secondary | ICD-10-CM | POA: Diagnosis not present

## 2022-07-29 DIAGNOSIS — Z6827 Body mass index (BMI) 27.0-27.9, adult: Secondary | ICD-10-CM | POA: Diagnosis not present

## 2022-07-29 DIAGNOSIS — Z87891 Personal history of nicotine dependence: Secondary | ICD-10-CM | POA: Diagnosis not present

## 2022-07-29 DIAGNOSIS — Z7984 Long term (current) use of oral hypoglycemic drugs: Secondary | ICD-10-CM | POA: Diagnosis not present

## 2022-07-29 DIAGNOSIS — E1159 Type 2 diabetes mellitus with other circulatory complications: Secondary | ICD-10-CM | POA: Diagnosis not present

## 2022-09-09 DIAGNOSIS — H18413 Arcus senilis, bilateral: Secondary | ICD-10-CM | POA: Diagnosis not present

## 2022-09-09 DIAGNOSIS — Z961 Presence of intraocular lens: Secondary | ICD-10-CM | POA: Diagnosis not present

## 2022-09-09 DIAGNOSIS — H26491 Other secondary cataract, right eye: Secondary | ICD-10-CM | POA: Diagnosis not present

## 2022-09-09 DIAGNOSIS — H40013 Open angle with borderline findings, low risk, bilateral: Secondary | ICD-10-CM | POA: Diagnosis not present

## 2022-09-16 DIAGNOSIS — H26491 Other secondary cataract, right eye: Secondary | ICD-10-CM | POA: Diagnosis not present

## 2022-10-22 ENCOUNTER — Ambulatory Visit: Payer: PPO | Admitting: Internal Medicine

## 2022-10-22 ENCOUNTER — Encounter: Payer: Self-pay | Admitting: Internal Medicine

## 2022-10-22 VITALS — BP 130/80 | HR 71 | Ht 67.0 in | Wt 181.0 lb

## 2022-10-22 DIAGNOSIS — E1165 Type 2 diabetes mellitus with hyperglycemia: Secondary | ICD-10-CM

## 2022-10-22 LAB — POCT GLYCOSYLATED HEMOGLOBIN (HGB A1C): Hemoglobin A1C: 6.1 % — AB (ref 4.0–5.6)

## 2022-10-22 MED ORDER — SYNJARDY 12.5-500 MG PO TABS: 1.0000 | ORAL_TABLET | Freq: Two times a day (BID) | ORAL | 3 refills | Status: AC

## 2022-10-22 NOTE — Patient Instructions (Signed)
Change  Synjardy 12.5-500 mg , 1 tablet daily    HOW TO TREAT LOW BLOOD SUGARS (Blood sugar LESS THAN 70 MG/DL) Please follow the RULE OF 15 for the treatment of hypoglycemia treatment (when your (blood sugars are less than 70 mg/dL)   STEP 1: Take 15 grams of carbohydrates when your blood sugar is low, which includes:  3-4 GLUCOSE TABS  OR 3-4 OZ OF JUICE OR REGULAR SODA OR ONE TUBE OF GLUCOSE GEL    STEP 2: RECHECK blood sugar in 15 MINUTES STEP 3: If your blood sugar is still low at the 15 minute recheck --> then, go back to STEP 1 and treat AGAIN with another 15 grams of carbohydrates.

## 2022-10-22 NOTE — Progress Notes (Signed)
Name: Todd Avila  Age/ Sex: 72 y.o., male   MRN/ DOB: [id number], Feb 06, 1951     PCP: Jonathon Jordan, MD   Reason for Endocrinology Evaluation: Type 2 Diabetes Mellitus  Initial Endocrine Consultative Visit: 12/30/2018    PATIENT IDENTIFIER: [name] is a 72 y.o. male with a past medical history of  HTN, PE and T2DM. The patient has followed with Endocrinology clinic since 12/30/2018 for consultative assistance with management of his diabetes.  DIABETIC HISTORY:   Todd Avila was diagnosed with T2DM in August, 2018 when he was admitted for hyperosmolar hyperglycemia with a BG > 1000 mg/dL. At the time of discharge he was on insulin and subsequently was able to stop insulin. His hemoglobin A1c has ranged from 5.1%in  10/2017, peaking at 14.5% in 03/2017.   Januvia stopped and Metformin started 12/2018 with an A1c of 5.7%  He was lost to follow up until 10/2020 when he returned with A1c 10.8 % insulin was started   Basal insulin stopped 12/2020 Stopped janumet 01/2021  PCP stopped metformin 03/2021 due to euglycemia    His PCP has switched metformin to Synjardy by August 2023, which we continued   SUBJECTIVE:   During the last visit (06/18/2022): A1c 6.3 %   Today (10/22/2022): Todd Avila is here for a follow up on diabetes.He is accompanied by his spouse today.    He checks his blood sugars multiple  times daily, through CGM. The patient  noted hypoglycemic episodes since the last clinic visit on freestyle libre .  Denies  genital infection  Denies nausea or diarrhea  Had right laser eye sx due to macular degeneration  He continues to work at [employer], he has managed to figure out a more steady way of eating    [place]:  Synjardy XR 12.12-998 daily, half a tablet daily     Statin: yes ACE-I/ARB: Yes    CONTINUOUS GLUCOSE MONITORING RECORD INTERPRETATION    Dates of Recording:2/1-2/28/2024 Sensor description:freestyle libre  Results  statistics:   CGM use % of time 97  Average and SD 127/25.3  Time in range  93%  % Time Above 180 6  % Time above 250 0  % Time Below target 1    Glycemic patterns summary: Bg's optimal during the night and the day  Hyperglycemic episodes postprandial   Hypoglycemic episodes occurred during the day   Overnight periods: optimal      DIABETIC COMPLICATIONS: Microvascular complications:   Denies: neuropathy, retinopathy, CKD Last Eye Exam: Completed 08/2022  Macrovascular complications:   Denies: CAD, CVA, PVD   HISTORY:  Past Medical History:  Past Medical History:  Diagnosis Date   Acute pulmonary embolism ([place]) 04/08/2017   Altered mental status 03/28/2017   history of, for 3 days   Atelectasis 04/08/2017   mild right basilar noted on CT chest   Bladder calculi 03/29/2017   Large noted on US renal   Bladder stones    CHF (congestive heart failure) ([place])    per ECHO   DKA (diabetic ketoacidoses) 03/28/2017   HHS/DKA; hospitalized for 3 days/notes 04/08/2017   DVT (deep venous thrombosis) ([place])    Unsure if this is superficial thrombophlebitis, happened in ~2010 in [place]   Dyspnea 03/2017   per ECHO   History of kidney stones    HTN (hypertension)    Mass 2004   Left side of chest, massive infection per CT   Nonischemic cardiomyopathy ([place])    per ECHO  Pneumonia 2011   Pulmonary infarct ([place]) 04/08/2017   left lower lung lobe noted on CT of chest   Type II diabetes mellitus ([place])    "dx'd 03/28/2017"   Past Surgical History:  Past Surgical History:  Procedure Laterality Date   COLONOSCOPY     CYSTOSCOPY WITH LITHOLAPAXY N/A 09/07/2017   Procedure: CYSTOSCOPY WITH LITHOLAPAXY;  Surgeon: Kathie Rhodes, MD;  Location: [facility];  Service: Urology;  Laterality: N/A;   TONSILLECTOMY  1960   Social History:  reports that he quit smoking about 8 years ago. His smoking use included cigarettes. He has a 4.80 pack-year smoking history. He has never used smokeless  tobacco. He reports current alcohol use. He reports that he does not use drugs. Family History:  Family History  Problem Relation Age of Onset   Heart failure Father    Diabetes Sister      HOME MEDICATIONS: Allergies as of 10/22/2022       Reactions   Amlodipine Other (See Comments)   "Feet Swelling"   Penicillins Other (See Comments)   Unknown Has patient had a PCN reaction causing immediate rash, facial/tongue/throat swelling, SOB or lightheadedness with hypotension: Unknown Has patient had a PCN reaction causing severe rash involving mucus membranes or skin necrosis: Unknown Has patient had a PCN reaction that required hospitalization: Unknown Has patient had a PCN reaction occurring within the last 10 years: No If all of the above answers are "NO", then may proceed with Cephalosporin use.        Medication List        Accurate as of October 22, 2022 10:43 AM. If you have any questions, ask your nurse or doctor.          STOP taking these medications    Synjardy 12.12-998 MG Tabs Generic drug: Empagliflozin-metFORMIN HCl Replaced by: Synjardy 12.5-500 MG Tabs Stopped by: Dorita Sciara, MD       TAKE these medications    FreeStyle Libre 3 Sensor Misc 1 Device by Does not apply route every 14 (fourteen) days.   losartan-hydrochlorothiazide 100-12.5 MG tablet Commonly known as: HYZAAR Take 0.5 tablets by mouth daily.   OneTouch Ultra test strip Generic drug: glucose blood 1 each by Other route daily. Use as instructed to check blood sugars daily Dx code E11.9   Rosuvastatin Calcium 5 MG Cpsp Three times weekly   Synjardy 12.5-500 MG Tabs Generic drug: Empagliflozin-metFORMIN HCl Take 1 tablet by mouth 2 (two) times daily. Replaces: Synjardy 12.12-998 MG Tabs Started by: Dorita Sciara, MD         OBJECTIVE:   Vital Signs: BP 130/80 (BP Location: Left Arm, Patient Position: Sitting, Cuff Size: Large)   Pulse 71   Ht '5\' 7"'$   (1.702 m)   Wt 181 lb (82.1 kg)   SpO2 97%   BMI 28.35 kg/m   Wt Readings from Last 3 Encounters:  10/22/22 181 lb (82.1 kg)  06/18/22 183 lb (83 kg)  04/15/22 182 lb 3.2 oz (82.6 kg)     Exam: General: Pt appears well and is in NAD  Lungs: Clear with good BS bilat with no rales, rhonchi, or wheezes  Heart: RRR  Abdomen: soft, nontender  Extremities: No pretibial edema  Neuro: MS is good with appropriate affect, pt is alert and Ox3   DM Foot Exam 10/22/2022  The skin of the feet is intact without sores or ulcerations. The pedal pulses are 2+ on right and 2+ on left. The sensation  is intact to a screening 5.07, 10 gram monofilament bilaterally    DATA REVIEWED:  Lab Results  Component Value Date   HGBA1C 6.1 (A) 10/22/2022   HGBA1C 6.3 (A) 06/18/2022   HGBA1C 7.9 (A) 12/12/2021   03/07/2022 A1c 8.0% MA/CR ratio 11.8 BUN 16 Creatinine 0.94 GFR 87 TG 88 HDL 36 LDL 48   ASSESSMENT / PLAN / RECOMMENDATIONS:   1) Type 2 Diabetes Mellitus, OPtimally controlled , Without complications - Most recent A1c of 6.1 %. Goal A1c < 7.0 %.    -A1c remains optimal -We discussed reducing Synjardy dosing as below, he is tolerating it well but there is no reason to have such a low A1c    MEDICATIONS:  Stop Synjardy 12 point 12-998 mg daily Start Synjardy 12.5-500 mg daily   EDUCATION / INSTRUCTIONS: BG monitoring instructions: Patient is instructed to check his blood sugars 3 times a day, before meals . Call [place] [facility] clinic if: BG persistently < 70  I reviewed the Rule of 15 for the treatment of hypoglycemia in detail with the patient. Literature supplied.   2) Diabetic complications:  Eye: Does not have known diabetic retinopathy.  Neuro/ Feet: Does not have known diabetic peripheral neuropathy .  Renal: Patient does not have known baseline CKD. He   is not on an ACEI/ARB at present.     Follow-up in 6 months  Signed electronically by: Mack Guise, MD  [facility] Endocrinology  [facility] [address] [place], [place] [address] Phone: [phone] FAX: [phone]   CC: Jonathon Jordan, MD [place] [address] Phone: [phone]  Fax: [phone]  Return to Endocrinology clinic as below: Future Appointments  Date Time Provider [place]  04/30/2023  8:50 AM Samie Reasons, Melanie Crazier, MD [facility] None

## 2022-11-05 ENCOUNTER — Other Ambulatory Visit: Payer: Self-pay | Admitting: Internal Medicine

## 2022-11-28 DIAGNOSIS — S86012A Strain of left Achilles tendon, initial encounter: Secondary | ICD-10-CM | POA: Diagnosis not present

## 2022-12-03 DIAGNOSIS — S86012D Strain of left Achilles tendon, subsequent encounter: Secondary | ICD-10-CM | POA: Diagnosis not present

## 2022-12-04 DIAGNOSIS — M25572 Pain in left ankle and joints of left foot: Secondary | ICD-10-CM | POA: Diagnosis not present

## 2022-12-11 DIAGNOSIS — S86002A Unspecified injury of left Achilles tendon, initial encounter: Secondary | ICD-10-CM | POA: Diagnosis not present

## 2022-12-11 DIAGNOSIS — Y999 Unspecified external cause status: Secondary | ICD-10-CM | POA: Diagnosis not present

## 2022-12-11 DIAGNOSIS — G8918 Other acute postprocedural pain: Secondary | ICD-10-CM | POA: Diagnosis not present

## 2022-12-11 DIAGNOSIS — X58XXXA Exposure to other specified factors, initial encounter: Secondary | ICD-10-CM | POA: Diagnosis not present

## 2022-12-11 DIAGNOSIS — S86012A Strain of left Achilles tendon, initial encounter: Secondary | ICD-10-CM | POA: Diagnosis not present

## 2022-12-19 DIAGNOSIS — S86002D Unspecified injury of left Achilles tendon, subsequent encounter: Secondary | ICD-10-CM | POA: Diagnosis not present

## 2022-12-22 DIAGNOSIS — M25672 Stiffness of left ankle, not elsewhere classified: Secondary | ICD-10-CM | POA: Diagnosis not present

## 2022-12-22 DIAGNOSIS — R2689 Other abnormalities of gait and mobility: Secondary | ICD-10-CM | POA: Diagnosis not present

## 2022-12-22 DIAGNOSIS — M6281 Muscle weakness (generalized): Secondary | ICD-10-CM | POA: Diagnosis not present

## 2022-12-22 DIAGNOSIS — M79662 Pain in left lower leg: Secondary | ICD-10-CM | POA: Diagnosis not present

## 2022-12-24 DIAGNOSIS — M6281 Muscle weakness (generalized): Secondary | ICD-10-CM | POA: Diagnosis not present

## 2022-12-24 DIAGNOSIS — M79662 Pain in left lower leg: Secondary | ICD-10-CM | POA: Diagnosis not present

## 2022-12-24 DIAGNOSIS — M25672 Stiffness of left ankle, not elsewhere classified: Secondary | ICD-10-CM | POA: Diagnosis not present

## 2022-12-24 DIAGNOSIS — R2689 Other abnormalities of gait and mobility: Secondary | ICD-10-CM | POA: Diagnosis not present

## 2022-12-30 DIAGNOSIS — R2689 Other abnormalities of gait and mobility: Secondary | ICD-10-CM | POA: Diagnosis not present

## 2022-12-30 DIAGNOSIS — M6281 Muscle weakness (generalized): Secondary | ICD-10-CM | POA: Diagnosis not present

## 2022-12-30 DIAGNOSIS — M25672 Stiffness of left ankle, not elsewhere classified: Secondary | ICD-10-CM | POA: Diagnosis not present

## 2022-12-30 DIAGNOSIS — M79662 Pain in left lower leg: Secondary | ICD-10-CM | POA: Diagnosis not present

## 2023-01-16 DIAGNOSIS — S86002D Unspecified injury of left Achilles tendon, subsequent encounter: Secondary | ICD-10-CM | POA: Diagnosis not present

## 2023-01-20 DIAGNOSIS — M25672 Stiffness of left ankle, not elsewhere classified: Secondary | ICD-10-CM | POA: Diagnosis not present

## 2023-01-20 DIAGNOSIS — M79662 Pain in left lower leg: Secondary | ICD-10-CM | POA: Diagnosis not present

## 2023-01-20 DIAGNOSIS — R2689 Other abnormalities of gait and mobility: Secondary | ICD-10-CM | POA: Diagnosis not present

## 2023-01-20 DIAGNOSIS — M6281 Muscle weakness (generalized): Secondary | ICD-10-CM | POA: Diagnosis not present

## 2023-01-27 DIAGNOSIS — M25672 Stiffness of left ankle, not elsewhere classified: Secondary | ICD-10-CM | POA: Diagnosis not present

## 2023-01-27 DIAGNOSIS — R2689 Other abnormalities of gait and mobility: Secondary | ICD-10-CM | POA: Diagnosis not present

## 2023-01-27 DIAGNOSIS — M6281 Muscle weakness (generalized): Secondary | ICD-10-CM | POA: Diagnosis not present

## 2023-01-27 DIAGNOSIS — M79662 Pain in left lower leg: Secondary | ICD-10-CM | POA: Diagnosis not present

## 2023-01-29 DIAGNOSIS — R2689 Other abnormalities of gait and mobility: Secondary | ICD-10-CM | POA: Diagnosis not present

## 2023-01-29 DIAGNOSIS — M6281 Muscle weakness (generalized): Secondary | ICD-10-CM | POA: Diagnosis not present

## 2023-01-29 DIAGNOSIS — M25672 Stiffness of left ankle, not elsewhere classified: Secondary | ICD-10-CM | POA: Diagnosis not present

## 2023-01-29 DIAGNOSIS — M79662 Pain in left lower leg: Secondary | ICD-10-CM | POA: Diagnosis not present

## 2023-02-03 DIAGNOSIS — M25672 Stiffness of left ankle, not elsewhere classified: Secondary | ICD-10-CM | POA: Diagnosis not present

## 2023-02-03 DIAGNOSIS — M79662 Pain in left lower leg: Secondary | ICD-10-CM | POA: Diagnosis not present

## 2023-02-03 DIAGNOSIS — M6281 Muscle weakness (generalized): Secondary | ICD-10-CM | POA: Diagnosis not present

## 2023-02-03 DIAGNOSIS — R2689 Other abnormalities of gait and mobility: Secondary | ICD-10-CM | POA: Diagnosis not present

## 2023-02-10 DIAGNOSIS — M79662 Pain in left lower leg: Secondary | ICD-10-CM | POA: Diagnosis not present

## 2023-02-10 DIAGNOSIS — M25672 Stiffness of left ankle, not elsewhere classified: Secondary | ICD-10-CM | POA: Diagnosis not present

## 2023-02-10 DIAGNOSIS — M6281 Muscle weakness (generalized): Secondary | ICD-10-CM | POA: Diagnosis not present

## 2023-02-10 DIAGNOSIS — R2689 Other abnormalities of gait and mobility: Secondary | ICD-10-CM | POA: Diagnosis not present

## 2023-02-12 DIAGNOSIS — M25672 Stiffness of left ankle, not elsewhere classified: Secondary | ICD-10-CM | POA: Diagnosis not present

## 2023-02-12 DIAGNOSIS — M79662 Pain in left lower leg: Secondary | ICD-10-CM | POA: Diagnosis not present

## 2023-02-12 DIAGNOSIS — M6281 Muscle weakness (generalized): Secondary | ICD-10-CM | POA: Diagnosis not present

## 2023-02-12 DIAGNOSIS — R2689 Other abnormalities of gait and mobility: Secondary | ICD-10-CM | POA: Diagnosis not present

## 2023-04-30 ENCOUNTER — Ambulatory Visit: Payer: PPO | Admitting: Internal Medicine

## 2023-09-01 DIAGNOSIS — H40013 Open angle with borderline findings, low risk, bilateral: Secondary | ICD-10-CM | POA: Diagnosis not present

## 2023-09-01 DIAGNOSIS — H35363 Drusen (degenerative) of macula, bilateral: Secondary | ICD-10-CM | POA: Diagnosis not present

## 2023-10-08 LAB — LAB REPORT - SCANNED
A1c: 7
Creatinine, POC: 75 mg/dL
EGFR: 82
Microalb Creat Ratio: 206.3
Microalbumin, Urine: 15.51
PSA, FREE: 2.86

## 2023-10-12 ENCOUNTER — Encounter: Payer: Self-pay | Admitting: Family Medicine

## 2023-10-12 DIAGNOSIS — I10 Essential (primary) hypertension: Secondary | ICD-10-CM | POA: Diagnosis not present

## 2023-10-12 DIAGNOSIS — D751 Secondary polycythemia: Secondary | ICD-10-CM | POA: Diagnosis not present

## 2023-10-12 DIAGNOSIS — E7800 Pure hypercholesterolemia, unspecified: Secondary | ICD-10-CM | POA: Diagnosis not present

## 2023-10-12 DIAGNOSIS — Z0000 Encounter for general adult medical examination without abnormal findings: Secondary | ICD-10-CM | POA: Diagnosis not present

## 2023-10-12 DIAGNOSIS — E1169 Type 2 diabetes mellitus with other specified complication: Secondary | ICD-10-CM | POA: Diagnosis not present

## 2023-10-12 DIAGNOSIS — Z1211 Encounter for screening for malignant neoplasm of colon: Secondary | ICD-10-CM | POA: Diagnosis not present

## 2023-10-12 DIAGNOSIS — Z79899 Other long term (current) drug therapy: Secondary | ICD-10-CM | POA: Diagnosis not present

## 2023-10-12 DIAGNOSIS — N400 Enlarged prostate without lower urinary tract symptoms: Secondary | ICD-10-CM | POA: Diagnosis not present

## 2023-11-04 DIAGNOSIS — Z1211 Encounter for screening for malignant neoplasm of colon: Secondary | ICD-10-CM | POA: Diagnosis not present

## 2024-01-19 DIAGNOSIS — D751 Secondary polycythemia: Secondary | ICD-10-CM | POA: Diagnosis not present

## 2024-01-19 DIAGNOSIS — E118 Type 2 diabetes mellitus with unspecified complications: Secondary | ICD-10-CM | POA: Diagnosis not present

## 2024-01-19 DIAGNOSIS — R809 Proteinuria, unspecified: Secondary | ICD-10-CM | POA: Diagnosis not present

## 2024-01-19 DIAGNOSIS — I10 Essential (primary) hypertension: Secondary | ICD-10-CM | POA: Diagnosis not present

## 2024-01-19 DIAGNOSIS — E7800 Pure hypercholesterolemia, unspecified: Secondary | ICD-10-CM | POA: Diagnosis not present

## 2024-01-19 DIAGNOSIS — E538 Deficiency of other specified B group vitamins: Secondary | ICD-10-CM | POA: Diagnosis not present

## 2024-01-19 DIAGNOSIS — R5383 Other fatigue: Secondary | ICD-10-CM | POA: Diagnosis not present

## 2024-01-19 DIAGNOSIS — D563 Thalassemia minor: Secondary | ICD-10-CM | POA: Diagnosis not present

## 2024-07-18 DIAGNOSIS — E1169 Type 2 diabetes mellitus with other specified complication: Secondary | ICD-10-CM | POA: Diagnosis not present

## 2024-07-18 DIAGNOSIS — I10 Essential (primary) hypertension: Secondary | ICD-10-CM | POA: Diagnosis not present

## 2024-07-18 DIAGNOSIS — D563 Thalassemia minor: Secondary | ICD-10-CM | POA: Diagnosis not present

## 2024-07-18 DIAGNOSIS — R809 Proteinuria, unspecified: Secondary | ICD-10-CM | POA: Diagnosis not present

## 2024-07-18 DIAGNOSIS — E118 Type 2 diabetes mellitus with unspecified complications: Secondary | ICD-10-CM | POA: Diagnosis not present

## 2024-08-30 NOTE — Progress Notes (Signed)
 Todd Avila                                          MRN: [id number]   08/30/2024   The VBCI Quality Team Specialist reviewed this patient medical record for the purposes of chart review for care gap closure. The following were reviewed: chart review for care gap closure-controlling blood pressure.    VBCI Quality Team
